# Patient Record
Sex: Female | Born: 1960
Health system: Southern US, Community
[De-identification: ages and names within clinical notes are randomized; demographics above are authoritative.]

## PROBLEM LIST (undated history)

## (undated) DIAGNOSIS — G709 Myoneural disorder, unspecified: Secondary | ICD-10-CM

## (undated) DIAGNOSIS — C4441 Basal cell carcinoma of skin of scalp and neck: Secondary | ICD-10-CM

## (undated) DIAGNOSIS — H269 Unspecified cataract: Secondary | ICD-10-CM

## (undated) DIAGNOSIS — J45909 Unspecified asthma, uncomplicated: Secondary | ICD-10-CM

## (undated) HISTORY — PX: AUGMENTATION MAMMAPLASTY: SUR837

## (undated) HISTORY — DX: Basal cell carcinoma of skin of scalp and neck: C44.41

## (undated) HISTORY — DX: Unspecified cataract: H26.9

## (undated) HISTORY — DX: Myoneural disorder, unspecified: G70.9

## (undated) HISTORY — DX: Unspecified asthma, uncomplicated: J45.909

---

## 1997-07-05 ENCOUNTER — Other Ambulatory Visit: Admission: RE | Admit: 1997-07-05 | Discharge: 1997-07-05 | Payer: Self-pay | Admitting: Obstetrics and Gynecology

## 1997-08-08 ENCOUNTER — Other Ambulatory Visit: Admission: RE | Admit: 1997-08-08 | Discharge: 1997-08-08 | Payer: Self-pay | Admitting: Ophthalmology

## 1998-01-15 ENCOUNTER — Inpatient Hospital Stay (HOSPITAL_COMMUNITY): Admission: AD | Admit: 1998-01-15 | Discharge: 1998-01-17 | Payer: Self-pay | Admitting: Obstetrics and Gynecology

## 1998-02-19 ENCOUNTER — Other Ambulatory Visit: Admission: RE | Admit: 1998-02-19 | Discharge: 1998-02-19 | Payer: Self-pay | Admitting: Obstetrics and Gynecology

## 1998-07-11 ENCOUNTER — Ambulatory Visit (HOSPITAL_BASED_OUTPATIENT_CLINIC_OR_DEPARTMENT_OTHER): Admission: RE | Admit: 1998-07-11 | Discharge: 1998-07-11 | Payer: Self-pay | Admitting: Plastic Surgery

## 1999-05-01 ENCOUNTER — Other Ambulatory Visit: Admission: RE | Admit: 1999-05-01 | Discharge: 1999-05-01 | Payer: Self-pay | Admitting: Obstetrics and Gynecology

## 2000-05-25 ENCOUNTER — Other Ambulatory Visit: Admission: RE | Admit: 2000-05-25 | Discharge: 2000-05-25 | Payer: Self-pay | Admitting: Obstetrics and Gynecology

## 2001-06-09 ENCOUNTER — Other Ambulatory Visit: Admission: RE | Admit: 2001-06-09 | Discharge: 2001-06-09 | Payer: Self-pay | Admitting: Obstetrics and Gynecology

## 2002-06-29 ENCOUNTER — Other Ambulatory Visit: Admission: RE | Admit: 2002-06-29 | Discharge: 2002-06-29 | Payer: Self-pay | Admitting: Obstetrics and Gynecology

## 2003-12-21 ENCOUNTER — Other Ambulatory Visit: Admission: RE | Admit: 2003-12-21 | Discharge: 2003-12-21 | Payer: Self-pay | Admitting: Obstetrics and Gynecology

## 2004-12-29 ENCOUNTER — Other Ambulatory Visit: Admission: RE | Admit: 2004-12-29 | Discharge: 2004-12-29 | Payer: Self-pay | Admitting: Obstetrics and Gynecology

## 2005-08-03 ENCOUNTER — Encounter: Admission: RE | Admit: 2005-08-03 | Discharge: 2005-08-03 | Payer: Self-pay | Admitting: Obstetrics and Gynecology

## 2006-08-06 ENCOUNTER — Encounter: Admission: RE | Admit: 2006-08-06 | Discharge: 2006-08-06 | Payer: Self-pay | Admitting: Obstetrics and Gynecology

## 2007-08-08 ENCOUNTER — Encounter: Admission: RE | Admit: 2007-08-08 | Discharge: 2007-08-08 | Payer: Self-pay | Admitting: Obstetrics and Gynecology

## 2008-08-23 ENCOUNTER — Encounter: Admission: RE | Admit: 2008-08-23 | Discharge: 2008-08-23 | Payer: Self-pay | Admitting: Obstetrics and Gynecology

## 2009-09-26 ENCOUNTER — Encounter: Admission: RE | Admit: 2009-09-26 | Discharge: 2009-09-26 | Payer: Self-pay | Admitting: Obstetrics and Gynecology

## 2010-08-12 ENCOUNTER — Other Ambulatory Visit: Payer: Self-pay | Admitting: Obstetrics and Gynecology

## 2010-08-12 DIAGNOSIS — Z1231 Encounter for screening mammogram for malignant neoplasm of breast: Secondary | ICD-10-CM

## 2010-10-06 ENCOUNTER — Ambulatory Visit: Payer: Self-pay

## 2010-10-07 ENCOUNTER — Ambulatory Visit
Admission: RE | Admit: 2010-10-07 | Discharge: 2010-10-07 | Disposition: A | Discharge status: Home / Self Care | Payer: 59 | Source: Ambulatory Visit | Attending: Obstetrics and Gynecology | Admitting: Obstetrics and Gynecology

## 2010-10-07 DIAGNOSIS — Z1231 Encounter for screening mammogram for malignant neoplasm of breast: Secondary | ICD-10-CM

## 2010-12-23 ENCOUNTER — Encounter (INDEPENDENT_AMBULATORY_CARE_PROVIDER_SITE_OTHER): Payer: 59 | Admitting: Ophthalmology

## 2010-12-23 DIAGNOSIS — G35 Multiple sclerosis: Secondary | ICD-10-CM

## 2010-12-23 DIAGNOSIS — H538 Other visual disturbances: Secondary | ICD-10-CM

## 2010-12-23 DIAGNOSIS — H469 Unspecified optic neuritis: Secondary | ICD-10-CM

## 2010-12-23 DIAGNOSIS — H43819 Vitreous degeneration, unspecified eye: Secondary | ICD-10-CM

## 2011-04-28 ENCOUNTER — Encounter (INDEPENDENT_AMBULATORY_CARE_PROVIDER_SITE_OTHER): Payer: 59 | Admitting: Ophthalmology

## 2011-07-28 ENCOUNTER — Encounter (INDEPENDENT_AMBULATORY_CARE_PROVIDER_SITE_OTHER): Payer: 59 | Admitting: Ophthalmology

## 2011-08-03 ENCOUNTER — Encounter (INDEPENDENT_AMBULATORY_CARE_PROVIDER_SITE_OTHER): Payer: 59 | Admitting: Ophthalmology

## 2011-08-03 DIAGNOSIS — H43819 Vitreous degeneration, unspecified eye: Secondary | ICD-10-CM

## 2011-08-03 DIAGNOSIS — H469 Unspecified optic neuritis: Secondary | ICD-10-CM

## 2011-09-01 ENCOUNTER — Other Ambulatory Visit: Payer: Self-pay | Admitting: Obstetrics and Gynecology

## 2011-09-01 DIAGNOSIS — Z1231 Encounter for screening mammogram for malignant neoplasm of breast: Secondary | ICD-10-CM

## 2011-11-18 ENCOUNTER — Ambulatory Visit
Admission: RE | Admit: 2011-11-18 | Discharge: 2011-11-18 | Disposition: A | Discharge status: Home / Self Care | Payer: 59 | Source: Ambulatory Visit | Attending: Obstetrics and Gynecology | Admitting: Obstetrics and Gynecology

## 2011-11-18 DIAGNOSIS — Z1231 Encounter for screening mammogram for malignant neoplasm of breast: Secondary | ICD-10-CM

## 2012-08-02 ENCOUNTER — Ambulatory Visit (INDEPENDENT_AMBULATORY_CARE_PROVIDER_SITE_OTHER): Payer: 59 | Admitting: Ophthalmology

## 2012-11-22 ENCOUNTER — Other Ambulatory Visit: Payer: Self-pay

## 2012-11-22 DIAGNOSIS — Z1231 Encounter for screening mammogram for malignant neoplasm of breast: Secondary | ICD-10-CM

## 2012-12-27 ENCOUNTER — Ambulatory Visit
Admission: RE | Admit: 2012-12-27 | Discharge: 2012-12-27 | Disposition: A | Discharge status: Home / Self Care | Payer: BC Managed Care – PPO | Source: Ambulatory Visit

## 2012-12-27 DIAGNOSIS — Z1231 Encounter for screening mammogram for malignant neoplasm of breast: Secondary | ICD-10-CM

## 2013-11-30 ENCOUNTER — Other Ambulatory Visit: Payer: Self-pay

## 2013-11-30 DIAGNOSIS — Z1231 Encounter for screening mammogram for malignant neoplasm of breast: Secondary | ICD-10-CM

## 2014-04-03 ENCOUNTER — Ambulatory Visit
Admission: RE | Admit: 2014-04-03 | Discharge: 2014-04-03 | Disposition: A | Discharge status: Home / Self Care | Payer: BLUE CROSS/BLUE SHIELD | Source: Ambulatory Visit

## 2014-04-03 DIAGNOSIS — Z1231 Encounter for screening mammogram for malignant neoplasm of breast: Secondary | ICD-10-CM

## 2014-08-30 ENCOUNTER — Other Ambulatory Visit: Payer: Self-pay | Admitting: General Surgery

## 2014-08-30 DIAGNOSIS — R1909 Other intra-abdominal and pelvic swelling, mass and lump: Secondary | ICD-10-CM

## 2014-09-04 ENCOUNTER — Ambulatory Visit
Admission: RE | Admit: 2014-09-04 | Discharge: 2014-09-04 | Disposition: A | Discharge status: Home / Self Care | Payer: BLUE CROSS/BLUE SHIELD | Source: Ambulatory Visit | Attending: General Surgery | Admitting: General Surgery

## 2014-09-04 MED ORDER — IOHEXOL 300 MG/ML  SOLN
100.0000 mL | Freq: Once | INTRAMUSCULAR | Status: AC | PRN
  Administered 2014-09-04: 100 mL via INTRAVENOUS

## 2015-05-27 ENCOUNTER — Other Ambulatory Visit: Payer: Self-pay

## 2015-05-27 DIAGNOSIS — Z1231 Encounter for screening mammogram for malignant neoplasm of breast: Secondary | ICD-10-CM

## 2015-06-25 ENCOUNTER — Ambulatory Visit: Payer: BLUE CROSS/BLUE SHIELD

## 2015-07-10 ENCOUNTER — Ambulatory Visit: Payer: BLUE CROSS/BLUE SHIELD

## 2015-07-12 ENCOUNTER — Ambulatory Visit
Admission: RE | Admit: 2015-07-12 | Discharge: 2015-07-12 | Disposition: A | Discharge status: Home / Self Care | Payer: BLUE CROSS/BLUE SHIELD | Source: Ambulatory Visit

## 2015-07-12 DIAGNOSIS — Z1231 Encounter for screening mammogram for malignant neoplasm of breast: Secondary | ICD-10-CM

## 2016-03-27 ENCOUNTER — Other Ambulatory Visit: Payer: Self-pay | Admitting: Obstetrics and Gynecology

## 2016-03-27 ENCOUNTER — Ambulatory Visit
Admission: RE | Admit: 2016-03-27 | Discharge: 2016-03-27 | Disposition: A | Discharge status: Home / Self Care | Payer: BLUE CROSS/BLUE SHIELD | Source: Ambulatory Visit | Attending: Obstetrics and Gynecology | Admitting: Obstetrics and Gynecology

## 2016-03-27 DIAGNOSIS — Z09 Encounter for follow-up examination after completed treatment for conditions other than malignant neoplasm: Secondary | ICD-10-CM

## 2016-06-29 ENCOUNTER — Other Ambulatory Visit: Payer: Self-pay | Admitting: Obstetrics and Gynecology

## 2016-06-29 DIAGNOSIS — Z1231 Encounter for screening mammogram for malignant neoplasm of breast: Secondary | ICD-10-CM

## 2016-07-17 ENCOUNTER — Ambulatory Visit: Payer: BLUE CROSS/BLUE SHIELD

## 2016-08-07 ENCOUNTER — Ambulatory Visit: Payer: BLUE CROSS/BLUE SHIELD

## 2016-09-10 ENCOUNTER — Ambulatory Visit: Payer: BLUE CROSS/BLUE SHIELD

## 2016-09-18 ENCOUNTER — Ambulatory Visit
Admission: RE | Admit: 2016-09-18 | Discharge: 2016-09-18 | Disposition: A | Discharge status: Home / Self Care | Payer: BLUE CROSS/BLUE SHIELD | Source: Ambulatory Visit | Attending: Obstetrics and Gynecology | Admitting: Obstetrics and Gynecology

## 2016-09-18 DIAGNOSIS — Z1231 Encounter for screening mammogram for malignant neoplasm of breast: Secondary | ICD-10-CM

## 2017-08-26 ENCOUNTER — Other Ambulatory Visit: Payer: Self-pay | Admitting: Obstetrics and Gynecology

## 2017-08-26 DIAGNOSIS — Z1231 Encounter for screening mammogram for malignant neoplasm of breast: Secondary | ICD-10-CM

## 2017-09-29 ENCOUNTER — Ambulatory Visit: Payer: BLUE CROSS/BLUE SHIELD

## 2017-10-21 ENCOUNTER — Ambulatory Visit
Admission: RE | Admit: 2017-10-21 | Discharge: 2017-10-21 | Disposition: A | Discharge status: Home / Self Care | Payer: BLUE CROSS/BLUE SHIELD | Source: Ambulatory Visit | Attending: Obstetrics and Gynecology | Admitting: Obstetrics and Gynecology

## 2017-10-21 DIAGNOSIS — Z1231 Encounter for screening mammogram for malignant neoplasm of breast: Secondary | ICD-10-CM

## 2017-11-29 DIAGNOSIS — X32XXXD Exposure to sunlight, subsequent encounter: Secondary | ICD-10-CM | POA: Diagnosis not present

## 2017-11-29 DIAGNOSIS — L570 Actinic keratosis: Secondary | ICD-10-CM | POA: Diagnosis not present

## 2017-11-29 DIAGNOSIS — L821 Other seborrheic keratosis: Secondary | ICD-10-CM | POA: Diagnosis not present

## 2017-11-29 DIAGNOSIS — D225 Melanocytic nevi of trunk: Secondary | ICD-10-CM | POA: Diagnosis not present

## 2017-12-23 DIAGNOSIS — C44212 Basal cell carcinoma of skin of right ear and external auricular canal: Secondary | ICD-10-CM | POA: Diagnosis not present

## 2018-02-22 DIAGNOSIS — G35 Multiple sclerosis: Secondary | ICD-10-CM | POA: Diagnosis not present

## 2018-02-22 DIAGNOSIS — Z79899 Other long term (current) drug therapy: Secondary | ICD-10-CM | POA: Diagnosis not present

## 2018-03-29 DIAGNOSIS — C44212 Basal cell carcinoma of skin of right ear and external auricular canal: Secondary | ICD-10-CM | POA: Diagnosis not present

## 2018-04-28 DIAGNOSIS — Z01419 Encounter for gynecological examination (general) (routine) without abnormal findings: Secondary | ICD-10-CM | POA: Diagnosis not present

## 2018-04-28 DIAGNOSIS — Z85820 Personal history of malignant melanoma of skin: Secondary | ICD-10-CM | POA: Diagnosis not present

## 2018-04-28 DIAGNOSIS — Z800 Family history of malignant neoplasm of digestive organs: Secondary | ICD-10-CM | POA: Diagnosis not present

## 2018-04-28 DIAGNOSIS — Z8049 Family history of malignant neoplasm of other genital organs: Secondary | ICD-10-CM | POA: Diagnosis not present

## 2018-04-28 DIAGNOSIS — Z6820 Body mass index (BMI) 20.0-20.9, adult: Secondary | ICD-10-CM | POA: Diagnosis not present

## 2018-04-28 DIAGNOSIS — Z8042 Family history of malignant neoplasm of prostate: Secondary | ICD-10-CM | POA: Diagnosis not present

## 2018-05-09 DIAGNOSIS — X32XXXD Exposure to sunlight, subsequent encounter: Secondary | ICD-10-CM | POA: Diagnosis not present

## 2018-05-09 DIAGNOSIS — L820 Inflamed seborrheic keratosis: Secondary | ICD-10-CM | POA: Diagnosis not present

## 2018-05-09 DIAGNOSIS — L570 Actinic keratosis: Secondary | ICD-10-CM | POA: Diagnosis not present

## 2018-05-18 DIAGNOSIS — J0100 Acute maxillary sinusitis, unspecified: Secondary | ICD-10-CM | POA: Diagnosis not present

## 2018-05-26 DIAGNOSIS — Z13820 Encounter for screening for osteoporosis: Secondary | ICD-10-CM | POA: Diagnosis not present

## 2018-05-31 DIAGNOSIS — Z0000 Encounter for general adult medical examination without abnormal findings: Secondary | ICD-10-CM | POA: Diagnosis not present

## 2018-06-07 DIAGNOSIS — Z23 Encounter for immunization: Secondary | ICD-10-CM | POA: Diagnosis not present

## 2018-06-07 DIAGNOSIS — Z0000 Encounter for general adult medical examination without abnormal findings: Secondary | ICD-10-CM | POA: Diagnosis not present

## 2018-08-30 DIAGNOSIS — D1039 Benign neoplasm of other parts of mouth: Secondary | ICD-10-CM | POA: Diagnosis not present

## 2018-09-02 DIAGNOSIS — R350 Frequency of micturition: Secondary | ICD-10-CM | POA: Diagnosis not present

## 2018-09-02 DIAGNOSIS — R3915 Urgency of urination: Secondary | ICD-10-CM | POA: Diagnosis not present

## 2018-09-02 DIAGNOSIS — N319 Neuromuscular dysfunction of bladder, unspecified: Secondary | ICD-10-CM | POA: Diagnosis not present

## 2018-09-02 DIAGNOSIS — N3941 Urge incontinence: Secondary | ICD-10-CM | POA: Diagnosis not present

## 2018-09-28 DIAGNOSIS — G35 Multiple sclerosis: Secondary | ICD-10-CM | POA: Diagnosis not present

## 2018-10-04 ENCOUNTER — Other Ambulatory Visit: Payer: Self-pay | Admitting: Obstetrics and Gynecology

## 2018-10-04 DIAGNOSIS — Z1231 Encounter for screening mammogram for malignant neoplasm of breast: Secondary | ICD-10-CM

## 2018-11-21 ENCOUNTER — Ambulatory Visit: Payer: BLUE CROSS/BLUE SHIELD

## 2018-12-14 DIAGNOSIS — Z08 Encounter for follow-up examination after completed treatment for malignant neoplasm: Secondary | ICD-10-CM | POA: Diagnosis not present

## 2018-12-14 DIAGNOSIS — X32XXXD Exposure to sunlight, subsequent encounter: Secondary | ICD-10-CM | POA: Diagnosis not present

## 2018-12-14 DIAGNOSIS — L820 Inflamed seborrheic keratosis: Secondary | ICD-10-CM | POA: Diagnosis not present

## 2018-12-14 DIAGNOSIS — L570 Actinic keratosis: Secondary | ICD-10-CM | POA: Diagnosis not present

## 2018-12-14 DIAGNOSIS — Z85820 Personal history of malignant melanoma of skin: Secondary | ICD-10-CM | POA: Diagnosis not present

## 2018-12-14 DIAGNOSIS — Z1283 Encounter for screening for malignant neoplasm of skin: Secondary | ICD-10-CM | POA: Diagnosis not present

## 2019-01-02 ENCOUNTER — Ambulatory Visit: Payer: Self-pay

## 2019-02-13 ENCOUNTER — Ambulatory Visit
Admission: RE | Admit: 2019-02-13 | Discharge: 2019-02-13 | Disposition: A | Discharge status: Home / Self Care | Payer: BC Managed Care – PPO | Source: Ambulatory Visit | Attending: Obstetrics and Gynecology | Admitting: Obstetrics and Gynecology

## 2019-02-13 ENCOUNTER — Other Ambulatory Visit: Payer: Self-pay

## 2019-02-13 DIAGNOSIS — L820 Inflamed seborrheic keratosis: Secondary | ICD-10-CM | POA: Diagnosis not present

## 2019-02-13 DIAGNOSIS — L821 Other seborrheic keratosis: Secondary | ICD-10-CM | POA: Diagnosis not present

## 2019-02-13 DIAGNOSIS — Z1231 Encounter for screening mammogram for malignant neoplasm of breast: Secondary | ICD-10-CM

## 2019-02-14 DIAGNOSIS — Z20828 Contact with and (suspected) exposure to other viral communicable diseases: Secondary | ICD-10-CM | POA: Diagnosis not present

## 2019-03-28 DIAGNOSIS — Z03818 Encounter for observation for suspected exposure to other biological agents ruled out: Secondary | ICD-10-CM | POA: Diagnosis not present

## 2019-03-29 DIAGNOSIS — G35 Multiple sclerosis: Secondary | ICD-10-CM | POA: Diagnosis not present

## 2019-03-29 DIAGNOSIS — Z79899 Other long term (current) drug therapy: Secondary | ICD-10-CM | POA: Diagnosis not present

## 2019-04-07 DIAGNOSIS — Z20828 Contact with and (suspected) exposure to other viral communicable diseases: Secondary | ICD-10-CM | POA: Diagnosis not present

## 2019-04-07 DIAGNOSIS — Z03818 Encounter for observation for suspected exposure to other biological agents ruled out: Secondary | ICD-10-CM | POA: Diagnosis not present

## 2019-05-03 DIAGNOSIS — L821 Other seborrheic keratosis: Secondary | ICD-10-CM | POA: Diagnosis not present

## 2019-05-03 DIAGNOSIS — N390 Urinary tract infection, site not specified: Secondary | ICD-10-CM | POA: Diagnosis not present

## 2019-05-03 DIAGNOSIS — Z01419 Encounter for gynecological examination (general) (routine) without abnormal findings: Secondary | ICD-10-CM | POA: Diagnosis not present

## 2019-05-03 DIAGNOSIS — X32XXXD Exposure to sunlight, subsequent encounter: Secondary | ICD-10-CM | POA: Diagnosis not present

## 2019-05-03 DIAGNOSIS — L570 Actinic keratosis: Secondary | ICD-10-CM | POA: Diagnosis not present

## 2019-05-03 DIAGNOSIS — Z6820 Body mass index (BMI) 20.0-20.9, adult: Secondary | ICD-10-CM | POA: Diagnosis not present

## 2019-05-12 DIAGNOSIS — D485 Neoplasm of uncertain behavior of skin: Secondary | ICD-10-CM | POA: Diagnosis not present

## 2019-05-12 DIAGNOSIS — D2239 Melanocytic nevi of other parts of face: Secondary | ICD-10-CM | POA: Diagnosis not present

## 2019-05-12 DIAGNOSIS — L821 Other seborrheic keratosis: Secondary | ICD-10-CM | POA: Diagnosis not present

## 2019-05-23 DIAGNOSIS — S52501A Unspecified fracture of the lower end of right radius, initial encounter for closed fracture: Secondary | ICD-10-CM | POA: Diagnosis not present

## 2019-05-24 DIAGNOSIS — S52501A Unspecified fracture of the lower end of right radius, initial encounter for closed fracture: Secondary | ICD-10-CM | POA: Diagnosis not present

## 2019-05-25 DIAGNOSIS — Y999 Unspecified external cause status: Secondary | ICD-10-CM | POA: Diagnosis not present

## 2019-05-25 DIAGNOSIS — S52571A Other intraarticular fracture of lower end of right radius, initial encounter for closed fracture: Secondary | ICD-10-CM | POA: Diagnosis not present

## 2019-05-25 DIAGNOSIS — S52501A Unspecified fracture of the lower end of right radius, initial encounter for closed fracture: Secondary | ICD-10-CM | POA: Diagnosis not present

## 2019-05-25 DIAGNOSIS — X58XXXA Exposure to other specified factors, initial encounter: Secondary | ICD-10-CM | POA: Diagnosis not present

## 2019-06-02 DIAGNOSIS — S52501D Unspecified fracture of the lower end of right radius, subsequent encounter for closed fracture with routine healing: Secondary | ICD-10-CM | POA: Diagnosis not present

## 2019-06-06 DIAGNOSIS — N390 Urinary tract infection, site not specified: Secondary | ICD-10-CM | POA: Diagnosis not present

## 2019-06-12 DIAGNOSIS — Z13220 Encounter for screening for lipoid disorders: Secondary | ICD-10-CM | POA: Diagnosis not present

## 2019-06-12 DIAGNOSIS — G35 Multiple sclerosis: Secondary | ICD-10-CM | POA: Diagnosis not present

## 2019-06-12 DIAGNOSIS — M8580 Other specified disorders of bone density and structure, unspecified site: Secondary | ICD-10-CM | POA: Diagnosis not present

## 2019-06-12 DIAGNOSIS — S52591D Other fractures of lower end of right radius, subsequent encounter for closed fracture with routine healing: Secondary | ICD-10-CM | POA: Diagnosis not present

## 2019-06-12 DIAGNOSIS — Z85820 Personal history of malignant melanoma of skin: Secondary | ICD-10-CM | POA: Diagnosis not present

## 2019-06-14 DIAGNOSIS — L820 Inflamed seborrheic keratosis: Secondary | ICD-10-CM | POA: Diagnosis not present

## 2019-06-30 DIAGNOSIS — M25561 Pain in right knee: Secondary | ICD-10-CM | POA: Diagnosis not present

## 2019-06-30 DIAGNOSIS — S52501D Unspecified fracture of the lower end of right radius, subsequent encounter for closed fracture with routine healing: Secondary | ICD-10-CM | POA: Diagnosis not present

## 2019-07-03 DIAGNOSIS — G35 Multiple sclerosis: Secondary | ICD-10-CM | POA: Diagnosis not present

## 2019-07-12 DIAGNOSIS — L820 Inflamed seborrheic keratosis: Secondary | ICD-10-CM | POA: Diagnosis not present

## 2019-07-12 DIAGNOSIS — C44529 Squamous cell carcinoma of skin of other part of trunk: Secondary | ICD-10-CM | POA: Diagnosis not present

## 2019-07-12 DIAGNOSIS — C44521 Squamous cell carcinoma of skin of breast: Secondary | ICD-10-CM | POA: Diagnosis not present

## 2019-07-25 DIAGNOSIS — G35 Multiple sclerosis: Secondary | ICD-10-CM | POA: Diagnosis not present

## 2019-07-25 DIAGNOSIS — R2689 Other abnormalities of gait and mobility: Secondary | ICD-10-CM | POA: Diagnosis not present

## 2019-07-25 DIAGNOSIS — M9903 Segmental and somatic dysfunction of lumbar region: Secondary | ICD-10-CM | POA: Diagnosis not present

## 2019-07-25 DIAGNOSIS — M7541 Impingement syndrome of right shoulder: Secondary | ICD-10-CM | POA: Diagnosis not present

## 2019-07-28 DIAGNOSIS — G35 Multiple sclerosis: Secondary | ICD-10-CM | POA: Diagnosis not present

## 2019-07-28 DIAGNOSIS — R2689 Other abnormalities of gait and mobility: Secondary | ICD-10-CM | POA: Diagnosis not present

## 2019-07-28 DIAGNOSIS — M7541 Impingement syndrome of right shoulder: Secondary | ICD-10-CM | POA: Diagnosis not present

## 2019-07-28 DIAGNOSIS — M9903 Segmental and somatic dysfunction of lumbar region: Secondary | ICD-10-CM | POA: Diagnosis not present

## 2019-08-07 DIAGNOSIS — M9903 Segmental and somatic dysfunction of lumbar region: Secondary | ICD-10-CM | POA: Diagnosis not present

## 2019-08-07 DIAGNOSIS — M7541 Impingement syndrome of right shoulder: Secondary | ICD-10-CM | POA: Diagnosis not present

## 2019-08-07 DIAGNOSIS — G35 Multiple sclerosis: Secondary | ICD-10-CM | POA: Diagnosis not present

## 2019-08-07 DIAGNOSIS — R2689 Other abnormalities of gait and mobility: Secondary | ICD-10-CM | POA: Diagnosis not present

## 2019-08-09 DIAGNOSIS — Z1159 Encounter for screening for other viral diseases: Secondary | ICD-10-CM | POA: Diagnosis not present

## 2019-08-11 DIAGNOSIS — Z1211 Encounter for screening for malignant neoplasm of colon: Secondary | ICD-10-CM | POA: Diagnosis not present

## 2019-08-16 DIAGNOSIS — Z85828 Personal history of other malignant neoplasm of skin: Secondary | ICD-10-CM | POA: Diagnosis not present

## 2019-08-16 DIAGNOSIS — L818 Other specified disorders of pigmentation: Secondary | ICD-10-CM | POA: Diagnosis not present

## 2019-08-16 DIAGNOSIS — Z08 Encounter for follow-up examination after completed treatment for malignant neoplasm: Secondary | ICD-10-CM | POA: Diagnosis not present

## 2019-08-17 DIAGNOSIS — M7541 Impingement syndrome of right shoulder: Secondary | ICD-10-CM | POA: Diagnosis not present

## 2019-08-17 DIAGNOSIS — R2689 Other abnormalities of gait and mobility: Secondary | ICD-10-CM | POA: Diagnosis not present

## 2019-08-17 DIAGNOSIS — M9903 Segmental and somatic dysfunction of lumbar region: Secondary | ICD-10-CM | POA: Diagnosis not present

## 2019-08-17 DIAGNOSIS — G35 Multiple sclerosis: Secondary | ICD-10-CM | POA: Diagnosis not present

## 2019-09-04 DIAGNOSIS — M7541 Impingement syndrome of right shoulder: Secondary | ICD-10-CM | POA: Diagnosis not present

## 2019-09-04 DIAGNOSIS — R2689 Other abnormalities of gait and mobility: Secondary | ICD-10-CM | POA: Diagnosis not present

## 2019-09-04 DIAGNOSIS — G35 Multiple sclerosis: Secondary | ICD-10-CM | POA: Diagnosis not present

## 2019-09-04 DIAGNOSIS — M9903 Segmental and somatic dysfunction of lumbar region: Secondary | ICD-10-CM | POA: Diagnosis not present

## 2019-10-03 DIAGNOSIS — R350 Frequency of micturition: Secondary | ICD-10-CM | POA: Diagnosis not present

## 2019-10-04 DIAGNOSIS — G35 Multiple sclerosis: Secondary | ICD-10-CM | POA: Diagnosis not present

## 2019-10-25 DIAGNOSIS — L820 Inflamed seborrheic keratosis: Secondary | ICD-10-CM | POA: Diagnosis not present

## 2019-10-25 DIAGNOSIS — L570 Actinic keratosis: Secondary | ICD-10-CM | POA: Diagnosis not present

## 2019-10-25 DIAGNOSIS — X32XXXD Exposure to sunlight, subsequent encounter: Secondary | ICD-10-CM | POA: Diagnosis not present

## 2019-11-06 DIAGNOSIS — M4802 Spinal stenosis, cervical region: Secondary | ICD-10-CM | POA: Diagnosis not present

## 2019-11-09 DIAGNOSIS — G35 Multiple sclerosis: Secondary | ICD-10-CM | POA: Diagnosis not present

## 2019-11-27 DIAGNOSIS — G35 Multiple sclerosis: Secondary | ICD-10-CM | POA: Diagnosis not present

## 2019-11-28 DIAGNOSIS — G35 Multiple sclerosis: Secondary | ICD-10-CM | POA: Diagnosis not present

## 2019-11-29 DIAGNOSIS — G35 Multiple sclerosis: Secondary | ICD-10-CM | POA: Diagnosis not present

## 2020-01-17 ENCOUNTER — Other Ambulatory Visit: Payer: Self-pay | Admitting: Obstetrics and Gynecology

## 2020-01-17 DIAGNOSIS — Z1231 Encounter for screening mammogram for malignant neoplasm of breast: Secondary | ICD-10-CM

## 2020-01-31 DIAGNOSIS — X32XXXD Exposure to sunlight, subsequent encounter: Secondary | ICD-10-CM | POA: Diagnosis not present

## 2020-01-31 DIAGNOSIS — Z1283 Encounter for screening for malignant neoplasm of skin: Secondary | ICD-10-CM | POA: Diagnosis not present

## 2020-01-31 DIAGNOSIS — Z86006 Personal history of melanoma in-situ: Secondary | ICD-10-CM | POA: Diagnosis not present

## 2020-01-31 DIAGNOSIS — L570 Actinic keratosis: Secondary | ICD-10-CM | POA: Diagnosis not present

## 2020-01-31 DIAGNOSIS — L821 Other seborrheic keratosis: Secondary | ICD-10-CM | POA: Diagnosis not present

## 2020-01-31 DIAGNOSIS — L820 Inflamed seborrheic keratosis: Secondary | ICD-10-CM | POA: Diagnosis not present

## 2020-01-31 DIAGNOSIS — Z08 Encounter for follow-up examination after completed treatment for malignant neoplasm: Secondary | ICD-10-CM | POA: Diagnosis not present

## 2020-02-02 DIAGNOSIS — M25561 Pain in right knee: Secondary | ICD-10-CM | POA: Diagnosis not present

## 2020-02-08 DIAGNOSIS — M25562 Pain in left knee: Secondary | ICD-10-CM | POA: Diagnosis not present

## 2020-02-08 DIAGNOSIS — M25561 Pain in right knee: Secondary | ICD-10-CM | POA: Diagnosis not present

## 2020-02-22 DIAGNOSIS — M25561 Pain in right knee: Secondary | ICD-10-CM | POA: Diagnosis not present

## 2020-02-29 ENCOUNTER — Ambulatory Visit: Payer: BC Managed Care – PPO | Admitting: Sports Medicine

## 2020-03-06 ENCOUNTER — Ambulatory Visit: Payer: BC Managed Care – PPO

## 2020-03-07 ENCOUNTER — Ambulatory Visit: Payer: BC Managed Care – PPO | Admitting: Sports Medicine

## 2020-03-12 ENCOUNTER — Ambulatory Visit: Payer: BC Managed Care – PPO | Admitting: Sports Medicine

## 2020-03-12 ENCOUNTER — Encounter: Payer: Self-pay | Admitting: Sports Medicine

## 2020-03-12 ENCOUNTER — Other Ambulatory Visit: Payer: Self-pay

## 2020-03-12 VITALS — BP 108/68 | Ht 62.0 in | Wt 110.0 lb

## 2020-03-12 DIAGNOSIS — M2141 Flat foot [pes planus] (acquired), right foot: Secondary | ICD-10-CM

## 2020-03-12 DIAGNOSIS — M2142 Flat foot [pes planus] (acquired), left foot: Secondary | ICD-10-CM | POA: Diagnosis not present

## 2020-03-12 NOTE — Progress Notes (Signed)
   Subjective:    Patient ID: Tara Ross, female    DOB: 05-May-1960, 59 y.o.   MRN: [id number]  HPI chief complaint: Bilateral knee pain  Patient is a very pleasant 59 year old female referred over by Dr. Griffin Basil for possible custom orthotics.  She has a history of hypermobility and significant DJD in each knee.  Dr. Griffin Basil recommended custom orthotics for her pes planus.  She has not had custom orthotics in the past but her daughter has had them for hypermobility and has had good success with them.    Review of Systems    As above Objective:   Physical Exam  Well-developed, well-nourished.  No acute distress  Examination of both knees shows good range of motion.  No effusion.  Genu valgus with standing.  Patient has pronation and mild calcaneal valgus with standing.  Pronation with walking.  Slight antalgic gait.      Assessment & Plan:  Bilateral knee DJD Hypermobility Pes planus  I think custom orthotics are worth trying.  Patient is in agreement. Custom orthotics are created as below.  Patient's gait was neutral with orthotics in place.  She found them to be comfortable prior to leaving the office.  Follow-up as needed.  Patient was fitted for a : standard, cushioned, semi-rigid orthotic. The orthotic was heated and afterward the patient stood on the orthotic blank positioned on the orthotic stand. The patient was positioned in subtalar neutral position and 10 degrees of ankle dorsiflexion in a weight bearing stance. After completion of molding, a stable base was applied to the orthotic blank. The blank was ground to a stable position for weight bearing. Size: 6 Base: Blue EVA Posting: none Additional orthotic padding: none

## 2020-03-28 DIAGNOSIS — H5212 Myopia, left eye: Secondary | ICD-10-CM | POA: Diagnosis not present

## 2020-03-28 DIAGNOSIS — H468 Other optic neuritis: Secondary | ICD-10-CM | POA: Diagnosis not present

## 2020-04-03 DIAGNOSIS — L820 Inflamed seborrheic keratosis: Secondary | ICD-10-CM | POA: Diagnosis not present

## 2020-04-03 DIAGNOSIS — Z08 Encounter for follow-up examination after completed treatment for malignant neoplasm: Secondary | ICD-10-CM | POA: Diagnosis not present

## 2020-04-03 DIAGNOSIS — Z85820 Personal history of malignant melanoma of skin: Secondary | ICD-10-CM | POA: Diagnosis not present

## 2020-04-10 DIAGNOSIS — G35 Multiple sclerosis: Secondary | ICD-10-CM | POA: Diagnosis not present

## 2020-04-16 ENCOUNTER — Ambulatory Visit
Admission: RE | Admit: 2020-04-16 | Discharge: 2020-04-16 | Disposition: A | Discharge status: Home / Self Care | Payer: BC Managed Care – PPO | Source: Ambulatory Visit | Attending: Obstetrics and Gynecology | Admitting: Obstetrics and Gynecology

## 2020-04-16 ENCOUNTER — Other Ambulatory Visit: Payer: Self-pay

## 2020-04-16 DIAGNOSIS — Z1231 Encounter for screening mammogram for malignant neoplasm of breast: Secondary | ICD-10-CM

## 2020-05-08 DIAGNOSIS — Z01419 Encounter for gynecological examination (general) (routine) without abnormal findings: Secondary | ICD-10-CM | POA: Diagnosis not present

## 2020-05-08 DIAGNOSIS — Z6820 Body mass index (BMI) 20.0-20.9, adult: Secondary | ICD-10-CM | POA: Diagnosis not present

## 2020-05-22 DIAGNOSIS — Z13820 Encounter for screening for osteoporosis: Secondary | ICD-10-CM | POA: Diagnosis not present

## 2020-05-27 DIAGNOSIS — D0439 Carcinoma in situ of skin of other parts of face: Secondary | ICD-10-CM | POA: Diagnosis not present

## 2020-05-27 DIAGNOSIS — L820 Inflamed seborrheic keratosis: Secondary | ICD-10-CM | POA: Diagnosis not present

## 2020-05-27 DIAGNOSIS — L821 Other seborrheic keratosis: Secondary | ICD-10-CM | POA: Diagnosis not present

## 2020-06-25 DIAGNOSIS — M25561 Pain in right knee: Secondary | ICD-10-CM | POA: Diagnosis not present

## 2020-06-25 DIAGNOSIS — M25562 Pain in left knee: Secondary | ICD-10-CM | POA: Diagnosis not present

## 2020-07-10 DIAGNOSIS — M810 Age-related osteoporosis without current pathological fracture: Secondary | ICD-10-CM | POA: Diagnosis not present

## 2020-07-10 DIAGNOSIS — Z13220 Encounter for screening for lipoid disorders: Secondary | ICD-10-CM | POA: Diagnosis not present

## 2020-07-10 DIAGNOSIS — G35 Multiple sclerosis: Secondary | ICD-10-CM | POA: Diagnosis not present

## 2020-07-17 DIAGNOSIS — D0439 Carcinoma in situ of skin of other parts of face: Secondary | ICD-10-CM | POA: Diagnosis not present

## 2020-08-14 DIAGNOSIS — D0439 Carcinoma in situ of skin of other parts of face: Secondary | ICD-10-CM | POA: Diagnosis not present

## 2020-09-03 DIAGNOSIS — M25561 Pain in right knee: Secondary | ICD-10-CM | POA: Diagnosis not present

## 2020-09-03 DIAGNOSIS — M25562 Pain in left knee: Secondary | ICD-10-CM | POA: Diagnosis not present

## 2020-09-25 DIAGNOSIS — R059 Cough, unspecified: Secondary | ICD-10-CM | POA: Diagnosis not present

## 2020-09-25 DIAGNOSIS — H9201 Otalgia, right ear: Secondary | ICD-10-CM | POA: Diagnosis not present

## 2020-10-01 DIAGNOSIS — L570 Actinic keratosis: Secondary | ICD-10-CM | POA: Diagnosis not present

## 2020-10-01 DIAGNOSIS — X32XXXD Exposure to sunlight, subsequent encounter: Secondary | ICD-10-CM | POA: Diagnosis not present

## 2020-10-01 DIAGNOSIS — Z08 Encounter for follow-up examination after completed treatment for malignant neoplasm: Secondary | ICD-10-CM | POA: Diagnosis not present

## 2020-10-01 DIAGNOSIS — Z85828 Personal history of other malignant neoplasm of skin: Secondary | ICD-10-CM | POA: Diagnosis not present

## 2020-10-01 DIAGNOSIS — L821 Other seborrheic keratosis: Secondary | ICD-10-CM | POA: Diagnosis not present

## 2020-10-04 DIAGNOSIS — H9201 Otalgia, right ear: Secondary | ICD-10-CM | POA: Diagnosis not present

## 2020-10-04 DIAGNOSIS — J329 Chronic sinusitis, unspecified: Secondary | ICD-10-CM | POA: Diagnosis not present

## 2020-11-06 DIAGNOSIS — Z85820 Personal history of malignant melanoma of skin: Secondary | ICD-10-CM | POA: Diagnosis not present

## 2020-11-06 DIAGNOSIS — T148XXA Other injury of unspecified body region, initial encounter: Secondary | ICD-10-CM | POA: Diagnosis not present

## 2020-11-06 DIAGNOSIS — Z08 Encounter for follow-up examination after completed treatment for malignant neoplasm: Secondary | ICD-10-CM | POA: Diagnosis not present

## 2020-11-06 DIAGNOSIS — L820 Inflamed seborrheic keratosis: Secondary | ICD-10-CM | POA: Diagnosis not present

## 2020-11-06 DIAGNOSIS — X32XXXD Exposure to sunlight, subsequent encounter: Secondary | ICD-10-CM | POA: Diagnosis not present

## 2020-11-06 DIAGNOSIS — L570 Actinic keratosis: Secondary | ICD-10-CM | POA: Diagnosis not present

## 2020-11-21 DIAGNOSIS — M25562 Pain in left knee: Secondary | ICD-10-CM | POA: Diagnosis not present

## 2020-11-21 DIAGNOSIS — M25561 Pain in right knee: Secondary | ICD-10-CM | POA: Diagnosis not present

## 2020-11-29 DIAGNOSIS — Z23 Encounter for immunization: Secondary | ICD-10-CM | POA: Diagnosis not present

## 2020-11-29 DIAGNOSIS — R233 Spontaneous ecchymoses: Secondary | ICD-10-CM | POA: Diagnosis not present

## 2020-12-03 DIAGNOSIS — M1711 Unilateral primary osteoarthritis, right knee: Secondary | ICD-10-CM | POA: Diagnosis not present

## 2020-12-10 DIAGNOSIS — M1711 Unilateral primary osteoarthritis, right knee: Secondary | ICD-10-CM | POA: Diagnosis not present

## 2020-12-17 DIAGNOSIS — M1711 Unilateral primary osteoarthritis, right knee: Secondary | ICD-10-CM | POA: Diagnosis not present

## 2021-01-08 DIAGNOSIS — G35 Multiple sclerosis: Secondary | ICD-10-CM | POA: Diagnosis not present

## 2021-01-21 DIAGNOSIS — R233 Spontaneous ecchymoses: Secondary | ICD-10-CM | POA: Diagnosis not present

## 2021-01-22 DIAGNOSIS — Z85820 Personal history of malignant melanoma of skin: Secondary | ICD-10-CM | POA: Diagnosis not present

## 2021-01-22 DIAGNOSIS — Z08 Encounter for follow-up examination after completed treatment for malignant neoplasm: Secondary | ICD-10-CM | POA: Diagnosis not present

## 2021-01-22 DIAGNOSIS — L570 Actinic keratosis: Secondary | ICD-10-CM | POA: Diagnosis not present

## 2021-01-22 DIAGNOSIS — Z1283 Encounter for screening for malignant neoplasm of skin: Secondary | ICD-10-CM | POA: Diagnosis not present

## 2021-01-22 DIAGNOSIS — X32XXXD Exposure to sunlight, subsequent encounter: Secondary | ICD-10-CM | POA: Diagnosis not present

## 2021-01-22 DIAGNOSIS — L820 Inflamed seborrheic keratosis: Secondary | ICD-10-CM | POA: Diagnosis not present

## 2021-01-22 DIAGNOSIS — T148XXD Other injury of unspecified body region, subsequent encounter: Secondary | ICD-10-CM | POA: Diagnosis not present

## 2021-01-23 DIAGNOSIS — R350 Frequency of micturition: Secondary | ICD-10-CM | POA: Diagnosis not present

## 2021-01-23 DIAGNOSIS — R338 Other retention of urine: Secondary | ICD-10-CM | POA: Diagnosis not present

## 2021-01-23 DIAGNOSIS — N318 Other neuromuscular dysfunction of bladder: Secondary | ICD-10-CM | POA: Diagnosis not present

## 2021-01-23 DIAGNOSIS — N3941 Urge incontinence: Secondary | ICD-10-CM | POA: Diagnosis not present

## 2021-01-28 ENCOUNTER — Telehealth: Payer: Self-pay | Admitting: *Deleted

## 2021-02-20 ENCOUNTER — Ambulatory Visit: Payer: BC Managed Care – PPO | Admitting: Sports Medicine

## 2021-02-20 DIAGNOSIS — M79671 Pain in right foot: Secondary | ICD-10-CM

## 2021-02-20 DIAGNOSIS — M79672 Pain in left foot: Secondary | ICD-10-CM

## 2021-02-20 NOTE — Progress Notes (Signed)
CC; Bilat. Foot pain  Patient has a long history of foot pain She had tried custom orthotics but as of the past The thicker orthotics would not fit or become comfortable in most shoes  She always has had some hypermobility I have seen her daughter for this She is currently followed by Dr. Griffin Basil for significant arthritis in her right knee He thinks orthotics would be helpful if she has significant arch collapse in the right foot is pronated and rotated  Physical exam Thin pleasant female in no acute distress BP 104/70   Ht 5\' 2"  (1.575 m)   BMI 20.12 kg/m   Right knee shows some medial joint line hypertrophy There is a valgus thrust on walking However it has excellent flexion and almost full extension without pain Left knee has full flexion and extension without pain  Feet show collapse of the longitudinal arch  she has navicular drop and subtalar shift allowing her foot to rotate outward Left 1 has resting pronation but does not have the same degree of rotation  Leg lengths are equal

## 2021-02-20 NOTE — Assessment & Plan Note (Signed)
Patient was fitted for a : standard, cushioned, semi-rigid orthotic. The orthotic was heated and afterward the patient stood on the orthotic blank positioned on the orthotic stand. The patient was positioned in subtalar neutral position and 10 degrees of ankle dorsiflexion in a weight bearing stance. After completion of molding, a stable base was applied to the orthotic blank. The blank was ground to a stable position for weight bearing. Size:7 thin dress orthotics Base: Thin, firm EVA with medial to lateral taper Posting: Scaphoid pad on RT Additional orthotic padding: none  Her gait was significantly improved after completion of the orthotics She had good comfort We will recheck these as needed

## 2021-02-22 DIAGNOSIS — Z20822 Contact with and (suspected) exposure to covid-19: Secondary | ICD-10-CM | POA: Diagnosis not present

## 2021-02-27 ENCOUNTER — Ambulatory Visit: Payer: BC Managed Care – PPO | Admitting: Nurse Practitioner

## 2021-03-04 ENCOUNTER — Inpatient Hospital Stay: Payer: BC Managed Care – PPO | Attending: Nurse Practitioner | Admitting: Nurse Practitioner

## 2021-03-04 ENCOUNTER — Other Ambulatory Visit: Payer: Self-pay

## 2021-03-04 ENCOUNTER — Inpatient Hospital Stay: Payer: BC Managed Care – PPO

## 2021-03-04 ENCOUNTER — Encounter: Payer: Self-pay | Admitting: Nurse Practitioner

## 2021-03-04 VITALS — BP 129/59 | HR 87 | Temp 98.1°F | Resp 20 | Ht 62.0 in | Wt 103.2 lb

## 2021-03-04 DIAGNOSIS — R233 Spontaneous ecchymoses: Secondary | ICD-10-CM

## 2021-03-04 DIAGNOSIS — G35 Multiple sclerosis: Secondary | ICD-10-CM

## 2021-03-04 DIAGNOSIS — D72810 Lymphocytopenia: Secondary | ICD-10-CM | POA: Insufficient documentation

## 2021-03-04 DIAGNOSIS — Z79899 Other long term (current) drug therapy: Secondary | ICD-10-CM

## 2021-03-04 LAB — CBC WITH DIFFERENTIAL (CANCER CENTER ONLY)
Abs Immature Granulocytes: 0.02 10*3/uL (ref 0.00–0.07)
Basophils Absolute: 0 10*3/uL (ref 0.0–0.1)
Basophils Relative: 0 %
Eosinophils Absolute: 0 10*3/uL (ref 0.0–0.5)
Eosinophils Relative: 0 %
HCT: 41.1 % (ref 36.0–46.0)
Hemoglobin: 13.1 g/dL (ref 12.0–15.0)
Immature Granulocytes: 0 %
Lymphocytes Relative: 3 %
Lymphs Abs: 0.3 10*3/uL — ABNORMAL LOW (ref 0.7–4.0)
MCH: 29.8 pg (ref 26.0–34.0)
MCHC: 31.9 g/dL (ref 30.0–36.0)
MCV: 93.6 fL (ref 80.0–100.0)
Monocytes Absolute: 0.8 10*3/uL (ref 0.1–1.0)
Monocytes Relative: 9 %
Neutro Abs: 7.6 10*3/uL (ref 1.7–7.7)
Neutrophils Relative %: 88 %
Platelet Count: 374 10*3/uL (ref 150–400)
RBC: 4.39 MIL/uL (ref 3.87–5.11)
RDW: 13.2 % (ref 11.5–15.5)
WBC Count: 8.7 10*3/uL (ref 4.0–10.5)
nRBC: 0 % (ref 0.0–0.2)

## 2021-03-04 LAB — APTT: aPTT: 28 seconds (ref 24–36)

## 2021-03-04 LAB — PROTIME-INR
INR: 0.9 (ref 0.8–1.2)
Prothrombin Time: 12 seconds (ref 11.4–15.2)

## 2021-03-04 NOTE — Progress Notes (Signed)
New Hematology/Oncology Consult   Requesting MD: Dr. Kathyrn Lass  [phone]  Reason for Consult: Abnormal bruising  HPI: [name] is a 60 year old woman referred for evaluation of abnormal bruising.  In May of this year she hit the left knee on a table resulting in a wound, subsequent bruising.  She estimates it took 6 to 8 weeks for complete healing to occur.  Since then she has noticed any "pressure", minor trauma on the lower legs and forearms results in a bruise which is slow to heal.  She denies spontaneous bruising/bleeding.  She denies other bleeding such as epistaxis, gingival bleeding, blood in urine or stool.  She is postmenopausal.  When she did have a menstrual cycle she does not feel the bleeding was excessive or prolonged, describes as "normal".  She had an inguinal hernia repair 8 to 9 years ago with no bleeding complication.  She had wisdom teeth removed in her 52s with no bleeding issues.  There is no family history of a bleeding disorder.  She has never had a blood transfusion.  She reports noticing that her hair is thinner and she feels cold within the same timeframe as when she noticed easy bruising 6 to 8 months ago.  She does not take aspirin.  No nonsteroidals.  No herbal supplements.  She is on no new medications.  Labs from 11/29/2020-hemoglobin 12.8, white count 4.2, platelet count 285,000; PT 10 (9.1-12), INR 0.9 (0.9-1.2), PTT 29.  Lymphocyte count 0.3  Past medical history: Multiple sclerosis   Past Surgical History:  Procedure Laterality Date   AUGMENTATION MAMMAPLASTY Bilateral      Current Outpatient Medications:    antiseptic oral rinse (BIOTENE) LIQD, 15 mLs by Mouth Rinse route as needed for dry mouth., Disp: , Rfl:    Ascorbic Acid (VITAMIN C) 1000 MG tablet, Take by mouth., Disp: , Rfl:    Calcium Carb-Cholecalciferol (CALCIUM 1000 + D PO), 1 tablet with meals, Disp: , Rfl:    Cholecalciferol (VITAMIN D) 50 MCG (2000 UT) CAPS, 1 capsule, Disp: , Rfl:     DALFAMPRIDINE ER PO, Take 10 mg by mouth 2 (two) times daily., Disp: , Rfl:    ferrous sulfate 325 (65 FE) MG tablet, Take 325 mg by mouth daily with breakfast., Disp: , Rfl:    Fingolimod HCl 0.5 MG CAPS, See admin instructions., Disp: , Rfl:    Magnesium 500 MG CAPS, 1 tablet with a meal, Disp: , Rfl:    Multiple Vitamin (MULTIVITAMIN ADULT PO), Take by mouth., Disp: , Rfl:    oxybutynin (DITROPAN-XL) 5 MG 24 hr tablet, Take by mouth., Disp: , Rfl: :   No Known Allergies:  FH: No family history of a bleeding disorder  SOCIAL HISTORY: She lives in [place].  She is married.  She is a retired Marine scientist.  No tobacco use since her 72s.  Rare EtOH intake.  Review of Systems: In addition to what is noted in the HPI she denies fever/sweats.  Appetite described as "okay".  She estimates 10 pounds of unintentional weight loss over the past 1 to 2 years.  She describes herself as a "picky eater".  No unusual headaches.  No vision change.  No dysphagia.  No nausea or vomiting.  No shortness of breath.  She has an occasional cough.  No change in bowel habits.  Periodic urinary frequency.  Physical Exam:  Blood pressure (!) 129/59, pulse 87, temperature 98.1 F (36.7 C), temperature source Oral, resp. rate 20, height 5'  2" (1.575 m), weight 103 lb 3.2 oz (46.8 kg), SpO2 100 %.  HEENT: No bruising or bleeding in the oral cavity. Lungs: Lungs clear bilaterally. Cardiac: Regular rate and rhythm. Abdomen: No hepatosplenomegaly. Vascular: No leg edema. Lymph nodes: No palpable cervical, supraclavicular, axillary or inguinal lymph nodes. Skin: Several ecchymoses at the right lower knee and just inferior to the knee.  Changes of senile purpura over the forearms.  No ecchymoses on the trunk.  LABS: Pending  RADIOLOGY:  No results found.  Assessment and Plan:   6 to 15-month history of easy bruising in the setting of minor trauma Multiple sclerosis  Ms. Schiller has been referred for evaluation  of easy bruising.  The bruising only occurs on the arms and lower legs in the setting of minor trauma.  No spontaneous bruising/bleeding.  The bruising may be due to senile purpura.  She will return to the lab today for a CBC, PT/PTT, von Willebrand panel and factor XIII activity.  We will contact her once those results are available.  We did not schedule formal follow-up at this time but will plan to see her back with any concerning laboratory findings or spontaneous bruising/bleeding.  Patient seen with Dr. Luberta Robertson, NP 03/04/2021, 1:12 PM   This was a shared visit with Ned Card.  [name] was interviewed and examined.  She is referred for evaluation of easy bruising.  The bruising occurs with trauma.  I have a low clinical suspicion for an inherited or acquired bleeding disorder.  She has undergone surgical procedures without excessive bleeding.  The platelet count has been normal.  I suspect the bruising is related to senile purpura and trauma.  We will obtain additional diagnostic evaluation to rule out a bleeding disorder.  I cannot relate the easy bruising to her medical regimen.  She reports taking same medications for multiple sclerosis for many years.  She reports chronic leukopenia (lymphopenia), potentially related to fingolimod.  We will contact her when the above laboratory evaluation has resulted.  I was present greater than 50% of today's visit.  I informed medical decision making.  Julieanne Manson, MD

## 2021-03-05 LAB — VON WILLEBRAND PANEL
Coagulation Factor VIII: 96 % (ref 56–140)
Ristocetin Co-factor, Plasma: 137 % (ref 50–200)
Von Willebrand Antigen, Plasma: 176 % (ref 50–200)

## 2021-03-05 LAB — COAG STUDIES INTERP REPORT

## 2021-03-06 LAB — FACTOR 13 ACTIVITY: Factor XIII, Qualitative: NORMAL

## 2021-03-07 ENCOUNTER — Other Ambulatory Visit: Payer: Self-pay | Admitting: Obstetrics and Gynecology

## 2021-03-07 ENCOUNTER — Telehealth: Payer: Self-pay

## 2021-03-07 DIAGNOSIS — Z1231 Encounter for screening mammogram for malignant neoplasm of breast: Secondary | ICD-10-CM

## 2021-03-07 NOTE — Telephone Encounter (Signed)
Called and left a voice message

## 2021-03-07 NOTE — Telephone Encounter (Signed)
-----   Message from Owens Shark, NP sent at 03/07/2021  3:36 PM EST ----- Please let her know the coagulation studies are normal, follow-up as needed

## 2021-03-10 ENCOUNTER — Telehealth: Payer: Self-pay

## 2021-03-10 NOTE — Telephone Encounter (Signed)
Called and spoke to patient, Patient voiced understanding

## 2021-03-10 NOTE — Telephone Encounter (Signed)
-----   Message from Owens Shark, NP sent at 03/07/2021  3:36 PM EST ----- Please let her know the coagulation studies are normal, follow-up as needed

## 2021-04-23 ENCOUNTER — Ambulatory Visit
Admission: RE | Admit: 2021-04-23 | Discharge: 2021-04-23 | Disposition: A | Discharge status: Home / Self Care | Payer: BC Managed Care – PPO | Source: Ambulatory Visit

## 2021-04-23 DIAGNOSIS — Z1231 Encounter for screening mammogram for malignant neoplasm of breast: Secondary | ICD-10-CM | POA: Diagnosis not present

## 2021-04-29 DIAGNOSIS — X32XXXD Exposure to sunlight, subsequent encounter: Secondary | ICD-10-CM | POA: Diagnosis not present

## 2021-04-29 DIAGNOSIS — L570 Actinic keratosis: Secondary | ICD-10-CM | POA: Diagnosis not present

## 2021-05-14 DIAGNOSIS — E559 Vitamin D deficiency, unspecified: Secondary | ICD-10-CM | POA: Diagnosis not present

## 2021-05-14 DIAGNOSIS — L659 Nonscarring hair loss, unspecified: Secondary | ICD-10-CM | POA: Diagnosis not present

## 2021-06-09 DIAGNOSIS — X32XXXD Exposure to sunlight, subsequent encounter: Secondary | ICD-10-CM | POA: Diagnosis not present

## 2021-06-09 DIAGNOSIS — L578 Other skin changes due to chronic exposure to nonionizing radiation: Secondary | ICD-10-CM | POA: Diagnosis not present

## 2021-06-09 DIAGNOSIS — C4442 Squamous cell carcinoma of skin of scalp and neck: Secondary | ICD-10-CM | POA: Diagnosis not present

## 2021-06-09 DIAGNOSIS — L0102 Bockhart's impetigo: Secondary | ICD-10-CM | POA: Diagnosis not present

## 2021-06-09 DIAGNOSIS — L570 Actinic keratosis: Secondary | ICD-10-CM | POA: Diagnosis not present

## 2021-06-09 DIAGNOSIS — C44311 Basal cell carcinoma of skin of nose: Secondary | ICD-10-CM | POA: Diagnosis not present

## 2021-06-09 DIAGNOSIS — L820 Inflamed seborrheic keratosis: Secondary | ICD-10-CM | POA: Diagnosis not present

## 2021-06-12 DIAGNOSIS — Z124 Encounter for screening for malignant neoplasm of cervix: Secondary | ICD-10-CM | POA: Diagnosis not present

## 2021-06-12 DIAGNOSIS — Z1151 Encounter for screening for human papillomavirus (HPV): Secondary | ICD-10-CM | POA: Diagnosis not present

## 2021-06-12 DIAGNOSIS — Z01419 Encounter for gynecological examination (general) (routine) without abnormal findings: Secondary | ICD-10-CM | POA: Diagnosis not present

## 2021-06-12 DIAGNOSIS — Z681 Body mass index (BMI) 19 or less, adult: Secondary | ICD-10-CM | POA: Diagnosis not present

## 2021-06-26 DIAGNOSIS — R300 Dysuria: Secondary | ICD-10-CM | POA: Diagnosis not present

## 2021-07-07 DIAGNOSIS — L818 Other specified disorders of pigmentation: Secondary | ICD-10-CM | POA: Diagnosis not present

## 2021-07-07 DIAGNOSIS — C44311 Basal cell carcinoma of skin of nose: Secondary | ICD-10-CM | POA: Diagnosis not present

## 2021-07-09 DIAGNOSIS — G35 Multiple sclerosis: Secondary | ICD-10-CM | POA: Diagnosis not present

## 2021-07-09 DIAGNOSIS — Z7969 Long term (current) use of other immunomodulators and immunosuppressants: Secondary | ICD-10-CM | POA: Diagnosis not present

## 2021-07-31 DIAGNOSIS — Z1159 Encounter for screening for other viral diseases: Secondary | ICD-10-CM | POA: Diagnosis not present

## 2021-07-31 DIAGNOSIS — Z13220 Encounter for screening for lipoid disorders: Secondary | ICD-10-CM | POA: Diagnosis not present

## 2021-07-31 DIAGNOSIS — Z0000 Encounter for general adult medical examination without abnormal findings: Secondary | ICD-10-CM | POA: Diagnosis not present

## 2021-07-31 DIAGNOSIS — M810 Age-related osteoporosis without current pathological fracture: Secondary | ICD-10-CM | POA: Diagnosis not present

## 2021-08-12 DIAGNOSIS — G35 Multiple sclerosis: Secondary | ICD-10-CM | POA: Diagnosis not present

## 2021-08-26 DIAGNOSIS — G35 Multiple sclerosis: Secondary | ICD-10-CM | POA: Diagnosis not present

## 2021-08-29 DIAGNOSIS — M25572 Pain in left ankle and joints of left foot: Secondary | ICD-10-CM | POA: Diagnosis not present

## 2021-09-13 DIAGNOSIS — G35 Multiple sclerosis: Secondary | ICD-10-CM | POA: Diagnosis not present

## 2021-09-14 DIAGNOSIS — G35 Multiple sclerosis: Secondary | ICD-10-CM | POA: Diagnosis not present

## 2021-09-15 DIAGNOSIS — G35 Multiple sclerosis: Secondary | ICD-10-CM | POA: Diagnosis not present

## 2021-10-03 DIAGNOSIS — G35 Multiple sclerosis: Secondary | ICD-10-CM | POA: Diagnosis not present

## 2021-10-03 DIAGNOSIS — R29898 Other symptoms and signs involving the musculoskeletal system: Secondary | ICD-10-CM | POA: Diagnosis not present

## 2021-10-16 ENCOUNTER — Ambulatory Visit: Payer: BC Managed Care – PPO | Admitting: Physical Therapy

## 2021-10-16 ENCOUNTER — Ambulatory Visit: Payer: BC Managed Care – PPO

## 2021-10-20 ENCOUNTER — Ambulatory Visit: Payer: BC Managed Care – PPO | Admitting: Physical Therapy

## 2021-10-22 NOTE — Therapy (Signed)
OUTPATIENT PHYSICAL THERAPY NEURO EVALUATION   Patient Name: Tara Ross MRN: [id number] DOB:1960/06/03, 61 y.o., female Today's Date: 10/24/2021   PCP: [facility]  REFERRING PROVIDER: Ala Bent, MD   PT End of Session - 10/24/21 0915     Visit Number 1    Number of Visits 13    Date for PT Re-Evaluation 12/05/21    Authorization Type BCBS    PT Start Time 0758    PT Stop Time 0842    PT Time Calculation (min) 44 min    Equipment Utilized During Treatment Gait belt    Activity Tolerance Patient tolerated treatment well    Behavior During Therapy WFL for tasks assessed/performed             Past Medical History:  Diagnosis Date   Asthma    Basal cell carcinoma of scalp and skin of face    Cataract    Neuromuscular disorder [facility])    Past Surgical History:  Procedure Laterality Date   AUGMENTATION MAMMAPLASTY Bilateral    Patient Active Problem List   Diagnosis Date Noted   Foot pain, bilateral 02/20/2021    ONSET DATE: Last MS exacerbation in Spring 2023  REFERRING DIAG: G35 (ICD-10-CM) - Multiple sclerosis  THERAPY DIAG:  Muscle weakness (generalized)  Unsteadiness on feet  Other abnormalities of gait and mobility  Rationale for Evaluation and Treatment Rehabilitation  SUBJECTIVE:                                                                                                                                                                                              SUBJECTIVE STATEMENT: Patient reports that she has had MS for 30 years. Never had PT before. Had exacerbation in spring. Consistently weaker in the R LE. Also with trouble with gait speed and balance. Has never used an AD. Sees a Physiological scientist consistently. Denies sensation changes, dizziness, vision changes.  Pt accompanied by: self  PERTINENT HISTORY: asthma, MS; pt reports chronic R patellar dislocation   PAIN:  Are you having pain?  No  PRECAUTIONS: Fall  WEIGHT BEARING RESTRICTIONS No  FALLS: Has patient fallen in last 6 months? No  LIVING ENVIRONMENT: Lives with: lives with their family Lives in: House/apartment Stairs:  couple steps to get in and a 2nd story  Has following equipment at home: Crutches  PLOF: Independent  PATIENT GOALS improve balance and R LE strength   OBJECTIVE:   DIAGNOSTIC FINDINGS: 08/12/21 brain MRI: No specific evidence of progressive demyelinating disease. No acute abnormality or active demyelination identified.  COGNITION: Overall cognitive status: Within functional  limits for tasks assessed   SENSATION: WFL  COORDINATION: B Alt pron/supination intact; finger to nose intact; heel to shin intact   MUSCLE TONE: LLE: Mild and in L quad   POSTURE: No Significant postural limitations  LOWER EXTREMITY ROM:     Active  Right Eval Left Eval  Hip flexion    Hip extension    Hip abduction    Hip adduction    Hip internal rotation    Hip external rotation    Knee flexion    Knee extension    Ankle dorsiflexion 16 14  Ankle plantarflexion    Ankle inversion    Ankle eversion     (Blank rows = not tested)  LOWER EXTREMITY MMT:    MMT Right Eval Left Eval  Hip flexion 4 5  Hip extension    Hip abduction 4 4  Hip adduction 4+ 4+  Hip internal rotation    Hip external rotation    Knee flexion 4 4+  Knee extension 4 4+  Ankle dorsiflexion 4+ 4+  Ankle plantarflexion 4 (completed 20 reps with heavy anterior trunk lean) 4 (completed 20 reps with heavy anterior trunk lean)  Ankle inversion    Ankle eversion    (Blank rows = not tested)   GAIT: Gait pattern:  decreased L step length and R LE with reduced knee flexion through swing  Level of assistance: Complete Independence   FUNCTIONAL TESTs:   [facility] PT Assessment - 10/24/21 0001       Standardized Balance Assessment   Standardized Balance Assessment Five Times Sit to Stand;10 meter walk test    Five  times sit to stand comments  9.91 sec   without UEs; limited eccentric control   10 Meter Walk 12.51 sec      Functional Gait  Assessment   Gait assessed  Yes    Gait Level Surface Walks 20 ft in less than 7 sec but greater than 5.5 sec, uses assistive device, slower speed, mild gait deviations, or deviates 6-10 in outside of the 12 in walkway width.    Change in Gait Speed Able to change speed, demonstrates mild gait deviations, deviates 6-10 in outside of the 12 in walkway width, or no gait deviations, unable to achieve a major change in velocity, or uses a change in velocity, or uses an assistive device.    Gait with Horizontal Head Turns Performs head turns smoothly with slight change in gait velocity (eg, minor disruption to smooth gait path), deviates 6-10 in outside 12 in walkway width, or uses an assistive device.    Gait with Vertical Head Turns Performs task with slight change in gait velocity (eg, minor disruption to smooth gait path), deviates 6 - 10 in outside 12 in walkway width or uses assistive device    Gait and Pivot Turn Pivot turns safely within 3 sec and stops quickly with no loss of balance.    Step Over Obstacle Is able to step over 2 stacked shoe boxes taped together (9 in total height) without changing gait speed. No evidence of imbalance.    Gait with Narrow Base of Support Ambulates 4-7 steps.    Gait with Eyes Closed Walks 20 ft, uses assistive device, slower speed, mild gait deviations, deviates 6-10 in outside 12 in walkway width. Ambulates 20 ft in less than 9 sec but greater than 7 sec.    Ambulating Backwards Walks 20 ft, uses assistive device, slower speed, mild gait deviations, deviates 6-10 in  outside 12 in walkway width.    Steps Two feet to a stair, must use rail.    Total Score 20               PATIENT EDUCATION: Education details: exam findings, prognosis, POC, HEP Person educated: Patient Education method: Explanation, Demonstration, Tactile cues,  Verbal cues, and Handouts Education comprehension: verbalized understanding and returned demonstration   HOME EXERCISE PROGRAM: Access Code: [email or web address] URL: [email or web address] Date: 10/24/2021 Prepared by: [facility]  Program Notes perform at a counter top or corner for safety  Exercises - Standing Terminal Knee Extension with Resistance  - 1 x daily - 5 x weekly - 2 sets - 10 reps - Sit to Stand with Arms Crossed  - 1 x daily - 5 x weekly - 2 sets - 10 reps - Sidelying Hip Abduction  - 1 x daily - 5 x weekly - 2 sets - 10 reps - Tandem Walking with Counter Support  - 1 x daily - 5 x weekly - 2 sets - 10 reps - Backward Walking with Counter Support  - 1 x daily - 5 x weekly - 2 sets - 10 reps - Romberg Stance with Eyes Closed  - 1 x daily - 5 x weekly - 2 sets - 30 sec hold   GOALS: Goals reviewed with patient? Yes  SHORT TERM GOALS: Target date: 11/14/2021  Patient to be independent with initial HEP. Baseline: HEP initiated Goal status: INITIAL    LONG TERM GOALS: Target date: 12/05/2021  Patient to be independent with advanced HEP. Baseline: Not yet initiated  Goal status: INITIAL  Patient to demonstrate alternating reciprocal pattern when ascending and descending stairs with good stability and 1 handrail as needed.   Baseline: step-to pattern Goal status: INITIAL  Patient to score at least 24/30 on FGA in order to decrease risk of falls.  Baseline: 20/30 Goal status: INITIAL  Patient to demonstrate gait speed of 3.44 ft/sec in order to meet MCID. Baseline: 2.62 ft/sec Goal status: INITIAL  Patient to demonstrate 5xSTS test in <15 sec and with good eccentric control in order to demonstrate improved functional strength. Baseline: poor eccentric control  Goal status: INITIAL   ASSESSMENT:  CLINICAL IMPRESSION:  Patient is a 61 y/o F, PMH significant for MS, presenting to OPPT with c/o increased R LE  weakness and trouble with balance since exacerbation in Spring 2023. Patient continues to be active with her personal trainer. Patient today presenting with mild increased L quad tone, R>L LE weakness, gait deviations, decreased gait speed, and imbalance. Patient's score on FGA indicates an increased risk of falls. Patient was educated on gentle strengthening and balance HEP and reported understanding. Would benefit from skilled PT services 2 x/week for 6 weeks to address aforementioned impairments in order to optimize level of function.     OBJECTIVE IMPAIRMENTS Abnormal gait, decreased activity tolerance, decreased balance, difficulty walking, decreased strength, increased muscle spasms, and impaired tone.   ACTIVITY LIMITATIONS carrying, lifting, squatting, stairs, and transfers  PARTICIPATION LIMITATIONS: meal prep, cleaning, laundry, shopping, community activity, yard work, and church  PERSONAL FACTORS Age, Fitness, Past/current experiences, Time since onset of injury/illness/exacerbation, and 3+ comorbidities: asthma, MS, R patellar dislocation  are also affecting patient's functional outcome.   REHAB POTENTIAL: Good  CLINICAL DECISION MAKING: Evolving/moderate complexity  EVALUATION COMPLEXITY: Moderate  PLAN: PT FREQUENCY: 2x/week  PT DURATION: 6 weeks  PLANNED INTERVENTIONS: Therapeutic exercises, Therapeutic activity, Neuromuscular re-education,  Balance training, Gait training, Patient/Family education, Self Care, Joint mobilization, Stair training, Vestibular training, Canalith repositioning, Aquatic Therapy, Dry Needling, Electrical stimulation, Cryotherapy, Moist heat, Taping, Manual therapy, and Re-evaluation  PLAN FOR NEXT SESSION: reassess HEP, progress R quad and hip strength, high level balance activities    Janene Harvey, PT, DPT 10/24/21 9:16 AM  [facility] [address] [place], [place] [address] Phone #  [phone] Fax # [phone]

## 2021-10-24 ENCOUNTER — Other Ambulatory Visit: Payer: Self-pay

## 2021-10-24 ENCOUNTER — Encounter: Payer: Self-pay | Admitting: Physical Therapy

## 2021-10-24 ENCOUNTER — Ambulatory Visit: Payer: BC Managed Care – PPO | Attending: Neurology | Admitting: Physical Therapy

## 2021-10-24 DIAGNOSIS — R2681 Unsteadiness on feet: Secondary | ICD-10-CM | POA: Diagnosis not present

## 2021-10-24 DIAGNOSIS — M6281 Muscle weakness (generalized): Secondary | ICD-10-CM | POA: Diagnosis not present

## 2021-10-24 DIAGNOSIS — R2689 Other abnormalities of gait and mobility: Secondary | ICD-10-CM | POA: Diagnosis not present

## 2021-10-27 NOTE — Therapy (Signed)
OUTPATIENT PHYSICAL THERAPY NEURO TREATMENT   Patient Name: Tara Ross MRN: [id number] DOB:1960/04/20, 61 y.o., female Today's Date: 10/28/2021   PCP: [facility]  REFERRING PROVIDER: Ala Bent, MD   PT End of Session - 10/28/21 0932     Visit Number 2    Number of Visits 13    Date for PT Re-Evaluation 12/05/21    Authorization Type BCBS    PT Start Time 0848    PT Stop Time 0928    PT Time Calculation (min) 40 min    Activity Tolerance Patient tolerated treatment well    Behavior During Therapy [facility] for tasks assessed/performed              Past Medical History:  Diagnosis Date   Asthma    Basal cell carcinoma of scalp and skin of face    Cataract    Neuromuscular disorder [facility])    Past Surgical History:  Procedure Laterality Date   AUGMENTATION MAMMAPLASTY Bilateral    Patient Active Problem List   Diagnosis Date Noted   Foot pain, bilateral 02/20/2021    ONSET DATE: Last MS exacerbation in Spring 2023  REFERRING DIAG: G35 (ICD-10-CM) - Multiple sclerosis  THERAPY DIAG:  Muscle weakness (generalized)  Unsteadiness on feet  Other abnormalities of gait and mobility  Rationale for Evaluation and Treatment Rehabilitation  SUBJECTIVE:                                                                                                                                                                                              SUBJECTIVE STATEMENT: Reports that she did the exercises 3x/week and having some R knee pain from the TKE.  Pt accompanied by: self  PERTINENT HISTORY: asthma, MS; pt reports chronic R patellar dislocation   PAIN:  Are you having pain? No; reports 1-2/10 discomfort in R knee   PRECAUTIONS: Fall  PATIENT GOALS improve balance and R LE strength   OBJECTIVE:     TODAY'S TREATMENT: 10/28/21 Activity Comments  Recumbent bike L2.5 x 5 min   STS R foot back 10x Focus on controlled descent   R  quad set 10x5" Folded pillow under ankle  R SLR 10x  Quad lag on the way down   R mod thomas stretch 2x30" PT OP  R/L supine fig 4 stretch 2x30" With patient OP; cues to avoid hiking hip  Bridge with red TB 10x Cues to maintain tension on TB  Clamshell with red loop 2x10 each Cues to roll hips anteriorly; limited ROM on R side d/t more difficultly maintaining form   R  TKE with red TB 10x3" Required correction of form and encouraged decreased knee flexion- pt noted improved tolerance for this exercise           Below measures were taken at time of initial evaluation unless otherwise specified:  DIAGNOSTIC FINDINGS: 08/12/21 brain MRI: No specific evidence of progressive demyelinating disease. No acute abnormality or active demyelination identified.  COGNITION: Overall cognitive status: Within functional limits for tasks assessed   SENSATION: WFL  COORDINATION: B Alt pron/supination intact; finger to nose intact; heel to shin intact   MUSCLE TONE: LLE: Mild and in L quad   POSTURE: No Significant postural limitations  LOWER EXTREMITY ROM:     Active  Right Eval Left Eval  Hip flexion    Hip extension    Hip abduction    Hip adduction    Hip internal rotation    Hip external rotation    Knee flexion    Knee extension    Ankle dorsiflexion 16 14  Ankle plantarflexion    Ankle inversion    Ankle eversion     (Blank rows = not tested)  LOWER EXTREMITY MMT:    MMT Right Eval Left Eval  Hip flexion 4 5  Hip extension    Hip abduction 4 4  Hip adduction 4+ 4+  Hip internal rotation    Hip external rotation    Knee flexion 4 4+  Knee extension 4 4+  Ankle dorsiflexion 4+ 4+  Ankle plantarflexion 4 (completed 20 reps with heavy anterior trunk lean) 4 (completed 20 reps with heavy anterior trunk lean)  Ankle inversion    Ankle eversion    (Blank rows = not tested)   GAIT: Gait pattern:  decreased L step length and R LE with reduced knee flexion through  swing  Level of assistance: Complete Independence   PATIENT EDUCATION: Education details: exam findings, prognosis, POC, HEP Person educated: Patient Education method: Explanation, Demonstration, Tactile cues, Verbal cues, and Handouts Education comprehension: verbalized understanding and returned demonstration   HOME EXERCISE PROGRAM: Access Code: [email or web address] URL: [email or web address] Date: 10/24/2021 Prepared by: [facility]  Program Notes perform at a counter top or corner for safety  Exercises - Standing Terminal Knee Extension with Resistance  - 1 x daily - 5 x weekly - 2 sets - 10 reps - Sit to Stand with Arms Crossed  - 1 x daily - 5 x weekly - 2 sets - 10 reps - Sidelying Hip Abduction  - 1 x daily - 5 x weekly - 2 sets - 10 reps - Tandem Walking with Counter Support  - 1 x daily - 5 x weekly - 2 sets - 10 reps - Backward Walking with Counter Support  - 1 x daily - 5 x weekly - 2 sets - 10 reps - Romberg Stance with Eyes Closed  - 1 x daily - 5 x weekly - 2 sets - 30 sec hold   GOALS: Goals reviewed with patient? Yes  SHORT TERM GOALS: Target date: 11/14/2021  Patient to be independent with initial HEP. Baseline: HEP initiated Goal status: IN PROGRESS    LONG TERM GOALS: Target date: 12/05/2021  Patient to be independent with advanced HEP. Baseline: Not yet initiated  Goal status: IN PROGRESS  Patient to demonstrate alternating reciprocal pattern when ascending and descending stairs with good stability and 1 handrail as needed.   Baseline: step-to pattern Goal status: IN PROGRESS  Patient to score at least  24/30 on FGA in order to decrease risk of falls.  Baseline: 20/30 Goal status: IN PROGRESS  Patient to demonstrate gait speed of 3.44 ft/sec in order to meet MCID. Baseline: 2.62 ft/sec Goal status: INITIAL  Patient to demonstrate 5xSTS test in <15 sec and with good eccentric control in order to  demonstrate improved functional strength. Baseline: poor eccentric control  Goal status: IN PROGRESS   ASSESSMENT:  CLINICAL IMPRESSION: Patient arrived to session with report of mild R knee pain. Worked on addressing quad strengthening in supine which was tolerated well. Patient with c/o hip tightness this AM which was addressed with stretching. Hip strengthening activities were performed with intermittent cueing for proper form and with good carryover. Patient reported some knee pain with R TKE at home, thus this was reviewed and required correction of form. Patient noted improved tolerance for this exercise after review and reported sensation of improved hip tension at end of session.       OBJECTIVE IMPAIRMENTS Abnormal gait, decreased activity tolerance, decreased balance, difficulty walking, decreased strength, increased muscle spasms, and impaired tone.   ACTIVITY LIMITATIONS carrying, lifting, squatting, stairs, and transfers  PARTICIPATION LIMITATIONS: meal prep, cleaning, laundry, shopping, community activity, yard work, and church  PERSONAL FACTORS Age, Fitness, Past/current experiences, Time since onset of injury/illness/exacerbation, and 3+ comorbidities: asthma, MS, R patellar dislocation  are also affecting patient's functional outcome.   REHAB POTENTIAL: Good  CLINICAL DECISION MAKING: Evolving/moderate complexity  EVALUATION COMPLEXITY: Moderate  PLAN: PT FREQUENCY: 2x/week  PT DURATION: 6 weeks  PLANNED INTERVENTIONS: Therapeutic exercises, Therapeutic activity, Neuromuscular re-education, Balance training, Gait training, Patient/Family education, Self Care, Joint mobilization, Stair training, Vestibular training, Canalith repositioning, Aquatic Therapy, Dry Needling, Electrical stimulation, Cryotherapy, Moist heat, Taping, Manual therapy, and Re-evaluation  PLAN FOR NEXT SESSION: reassess HEP, progress R quad and hip strength, high level balance activities     Janene Harvey, PT, DPT 10/28/21 9:33 AM  [facility] at [facility] [address] [place], [place] [address] Phone # [phone] Fax # [phone]

## 2021-10-28 ENCOUNTER — Encounter: Payer: Self-pay | Admitting: Physical Therapy

## 2021-10-28 ENCOUNTER — Ambulatory Visit: Payer: BC Managed Care – PPO | Admitting: Physical Therapy

## 2021-10-28 DIAGNOSIS — M6281 Muscle weakness (generalized): Secondary | ICD-10-CM

## 2021-10-28 DIAGNOSIS — R2681 Unsteadiness on feet: Secondary | ICD-10-CM | POA: Diagnosis not present

## 2021-10-28 DIAGNOSIS — M47816 Spondylosis without myelopathy or radiculopathy, lumbar region: Secondary | ICD-10-CM | POA: Diagnosis not present

## 2021-10-28 DIAGNOSIS — R2689 Other abnormalities of gait and mobility: Secondary | ICD-10-CM

## 2021-10-30 ENCOUNTER — Encounter: Payer: BC Managed Care – PPO | Admitting: Sports Medicine

## 2021-10-30 NOTE — Therapy (Signed)
OUTPATIENT PHYSICAL THERAPY NEURO TREATMENT   Patient Name: Tara Ross MRN: [id number] DOB:Nov 25, 1960, 61 y.o., female Today's Date: 10/31/2021   PCP: [facility]  REFERRING PROVIDER: Ala Bent, MD   PT End of Session - 10/31/21 0851     Visit Number 3    Number of Visits 13    Date for PT Re-Evaluation 12/05/21    Authorization Type BCBS    PT Start Time 0758    PT Stop Time 0848    PT Time Calculation (min) 50 min    Equipment Utilized During Treatment Gait belt    Activity Tolerance Patient tolerated treatment well    Behavior During Therapy WFL for tasks assessed/performed               Past Medical History:  Diagnosis Date   Asthma    Basal cell carcinoma of scalp and skin of face    Cataract    Neuromuscular disorder [facility])    Past Surgical History:  Procedure Laterality Date   AUGMENTATION MAMMAPLASTY Bilateral    Patient Active Problem List   Diagnosis Date Noted   Foot pain, bilateral 02/20/2021    ONSET DATE: Last MS exacerbation in Spring 2023  REFERRING DIAG: G35 (ICD-10-CM) - Multiple sclerosis  THERAPY DIAG:  Muscle weakness (generalized)  Unsteadiness on feet  Other abnormalities of gait and mobility  Rationale for Evaluation and Treatment Rehabilitation  SUBJECTIVE:                                                                                                                                                                                              SUBJECTIVE STATEMENT: "Doing good, my knee is just my knee." The TKE exercise was much easier on the knee after HEP review last session.  Pt accompanied by: self  PERTINENT HISTORY: asthma, MS; pt reports chronic R patellar dislocation   PAIN:  Are you having pain? No; reports 1-2/10 discomfort in R knee   PRECAUTIONS: Fall  PATIENT GOALS improve balance and R LE strength   OBJECTIVE:     TODAY'S TREATMENT: 10/31/21 Activity Comments   Bike L2.5 x 6 min   STS red TB 2x With mirror feedback and manual cues to increase R wt shift  SL hip abduction 10x Cues for alignment   R step up, down slow on 6" step Good control; hesitancy unlocking R knee  R step downs with heel touch 4" 10x Quad weakness   Sidestepping with red loop around ankles and toes 2x75f each  Cueing to maintain proper H-K-A alignment   Sitting KTOS 30" each  tandem walk in II bars  Core instability and occasional UE support   HOME EXERCISE PROGRAM Last updated: 10/31/21 Access Code: [email or web address] URL: [email or web address] Date: 10/31/2021 Prepared by: [facility]  Program Notes perform at a counter top or corner for safety  Exercises - Standing Terminal Knee Extension with Resistance  - 1 x daily - 5 x weekly - 2 sets - 10 reps - Sit to Stand with Arms Crossed  - 1 x daily - 5 x weekly - 2 sets - 10 reps - Sidelying Hip Abduction  - 1 x daily - 5 x weekly - 2 sets - 10 reps - Tandem Walking with Counter Support  - 1 x daily - 5 x weekly - 2 sets - 10 reps - Backward Walking with Counter Support  - 1 x daily - 5 x weekly - 2 sets - 10 reps - Romberg Stance with Eyes Closed  - 1 x daily - 5 x weekly - 2 sets - 30 sec hold - Side Stepping with Resistance at Feet  - 1 x daily - 5 x weekly - 2 sets - 10 reps   PATIENT EDUCATION: Education details: HEP update Person educated: Patient Education method: Explanation, Demonstration, Tactile cues, Verbal cues, and Handouts Education comprehension: verbalized understanding and returned demonstration   Below measures were taken at time of initial evaluation unless otherwise specified:  DIAGNOSTIC FINDINGS: 08/12/21 brain MRI: No specific evidence of progressive demyelinating disease. No acute abnormality or active demyelination identified.  COGNITION: Overall cognitive status: Within functional limits for tasks  assessed   SENSATION: WFL  COORDINATION: B Alt pron/supination intact; finger to nose intact; heel to shin intact   MUSCLE TONE: LLE: Mild and in L quad   POSTURE: No Significant postural limitations  LOWER EXTREMITY ROM:     Active  Right Eval Left Eval  Hip flexion    Hip extension    Hip abduction    Hip adduction    Hip internal rotation    Hip external rotation    Knee flexion    Knee extension    Ankle dorsiflexion 16 14  Ankle plantarflexion    Ankle inversion    Ankle eversion     (Blank rows = not tested)  LOWER EXTREMITY MMT:    MMT Right Eval Left Eval  Hip flexion 4 5  Hip extension    Hip abduction 4 4  Hip adduction 4+ 4+  Hip internal rotation    Hip external rotation    Knee flexion 4 4+  Knee extension 4 4+  Ankle dorsiflexion 4+ 4+  Ankle plantarflexion 4 (completed 20 reps with heavy anterior trunk lean) 4 (completed 20 reps with heavy anterior trunk lean)  Ankle inversion    Ankle eversion    (Blank rows = not tested)   GAIT: Gait pattern:  decreased L step length and R LE with reduced knee flexion through swing  Level of assistance: Complete Independence   PATIENT EDUCATION: Education details: exam findings, prognosis, POC, HEP Person educated: Patient Education method: Explanation, Demonstration, Tactile cues, Verbal cues, and Handouts Education comprehension: verbalized understanding and returned demonstration   HOME EXERCISE PROGRAM: Access Code: [email or web address] URL: [email or web address] Date: 10/24/2021 Prepared by: [facility]  Program Notes perform at a counter top or corner for safety  Exercises - Standing Terminal Knee Extension with Resistance  - 1 x daily - 5 x weekly - 2  sets - 10 reps - Sit to Stand with Arms Crossed  - 1 x daily - 5 x weekly - 2 sets - 10 reps - Sidelying Hip Abduction  - 1 x daily - 5 x weekly - 2 sets - 10 reps - Tandem Walking with Counter  Support  - 1 x daily - 5 x weekly - 2 sets - 10 reps - Backward Walking with Counter Support  - 1 x daily - 5 x weekly - 2 sets - 10 reps - Romberg Stance with Eyes Closed  - 1 x daily - 5 x weekly - 2 sets - 30 sec hold   GOALS: Goals reviewed with patient? Yes  SHORT TERM GOALS: Target date: 11/14/2021  Patient to be independent with initial HEP. Baseline: HEP initiated Goal status: IN PROGRESS    LONG TERM GOALS: Target date: 12/05/2021  Patient to be independent with advanced HEP. Baseline: Not yet initiated  Goal status: IN PROGRESS  Patient to demonstrate alternating reciprocal pattern when ascending and descending stairs with good stability and 1 handrail as needed.   Baseline: step-to pattern Goal status: IN PROGRESS  Patient to score at least 24/30 on FGA in order to decrease risk of falls.  Baseline: 20/30 Goal status: IN PROGRESS  Patient to demonstrate gait speed of 3.44 ft/sec in order to meet MCID. Baseline: 2.62 ft/sec Goal status: INITIAL  Patient to demonstrate 5xSTS test in <15 sec and with good eccentric control in order to demonstrate improved functional strength. Baseline: poor eccentric control  Goal status: IN PROGRESS   ASSESSMENT:  CLINICAL IMPRESSION: Patient arrived to session with report of improved understanding and tolerance of HEP. Continued working on STS with focus on increased R LE use; today using mirror and manual feedback. Hip strengthening activities revealed R>L weakness but with good effort on patient's part to proceed despite muscle fatigue. Some hesitancy demonstrated with balance tasks, requiring continued practice. No complaints at end of session.     OBJECTIVE IMPAIRMENTS Abnormal gait, decreased activity tolerance, decreased balance, difficulty walking, decreased strength, increased muscle spasms, and impaired tone.   ACTIVITY LIMITATIONS carrying, lifting, squatting, stairs, and transfers  PARTICIPATION LIMITATIONS: meal  prep, cleaning, laundry, shopping, community activity, yard work, and church  PERSONAL FACTORS Age, Fitness, Past/current experiences, Time since onset of injury/illness/exacerbation, and 3+ comorbidities: asthma, MS, R patellar dislocation  are also affecting patient's functional outcome.   REHAB POTENTIAL: Good  CLINICAL DECISION MAKING: Evolving/moderate complexity  EVALUATION COMPLEXITY: Moderate  PLAN: PT FREQUENCY: 2x/week  PT DURATION: 6 weeks  PLANNED INTERVENTIONS: Therapeutic exercises, Therapeutic activity, Neuromuscular re-education, Balance training, Gait training, Patient/Family education, Self Care, Joint mobilization, Stair training, Vestibular training, Canalith repositioning, Aquatic Therapy, Dry Needling, Electrical stimulation, Cryotherapy, Moist heat, Taping, Manual therapy, and Re-evaluation  PLAN FOR NEXT SESSION: progress R quad and hip strength, high level balance activities    Janene Harvey, PT, DPT 10/31/21 8:53 AM  [facility] at [facility] [address] [place],  [address] Phone # [phone] Fax # [phone]

## 2021-10-31 ENCOUNTER — Ambulatory Visit: Payer: BC Managed Care – PPO | Admitting: Physical Therapy

## 2021-10-31 ENCOUNTER — Encounter: Payer: Self-pay | Admitting: Physical Therapy

## 2021-10-31 DIAGNOSIS — R2689 Other abnormalities of gait and mobility: Secondary | ICD-10-CM

## 2021-10-31 DIAGNOSIS — R2681 Unsteadiness on feet: Secondary | ICD-10-CM

## 2021-10-31 DIAGNOSIS — M6281 Muscle weakness (generalized): Secondary | ICD-10-CM

## 2021-11-04 NOTE — Therapy (Signed)
OUTPATIENT PHYSICAL THERAPY NEURO TREATMENT   Patient Name: Tara Ross MRN: [id number] DOB:Mar 25, 1960, 61 y.o., female Today's Date: 11/05/2021   PCP: [facility]  REFERRING PROVIDER: Ala Bent, MD   PT End of Session - 11/05/21 1616     Visit Number 4    Number of Visits 13    Date for PT Re-Evaluation 12/05/21    Authorization Type BCBS    PT Start Time 1533    PT Stop Time 1614    PT Time Calculation (min) 41 min    Equipment Utilized During Treatment Gait belt    Activity Tolerance Patient tolerated treatment well    Behavior During Therapy WFL for tasks assessed/performed                Past Medical History:  Diagnosis Date   Asthma    Basal cell carcinoma of scalp and skin of face    Cataract    Neuromuscular disorder [facility])    Past Surgical History:  Procedure Laterality Date   AUGMENTATION MAMMAPLASTY Bilateral    Patient Active Problem List   Diagnosis Date Noted   Foot pain, bilateral 02/20/2021    ONSET DATE: Last MS exacerbation in Spring 2023  REFERRING DIAG: G35 (ICD-10-CM) - Multiple sclerosis  THERAPY DIAG:  Muscle weakness (generalized)  Unsteadiness on feet  Other abnormalities of gait and mobility  Rationale for Evaluation and Treatment Rehabilitation  SUBJECTIVE:                                                                                                                                                                                              SUBJECTIVE STATEMENT: The sidestepping with the band makes my knee sore.  Pt accompanied by: self  PERTINENT HISTORY: asthma, MS; pt reports chronic R patellar dislocation   PAIN:  Are you having pain? No  PRECAUTIONS: Fall  PATIENT GOALS improve balance and R LE strength   OBJECTIVE:     TODAY'S TREATMENT: 11/05/21 Activity Comments  Bike L3 x 5 min   Gait training with 1/2" heel wedge. 3/4", and no heel wedge in R shoe Scissoring  and increased M/L sway; slightly improved R knee flexion during swing; report of most comfort with 1/2" wedge compared to 3/4"  single leg STS to elevated mat; changed to STS with R single leg eccentric lower with B UE support  Report of difficulty d/t R knee   Sidestepping with red loop and yellow loop around forefoot along counter top  Lateral trunk lean which improved with yellow TB; also report of improved knee pain with yellow  Walking with EC 4x along counter top Cues for wide BOS  backwards walk 4x along counter top Cues for wide BOS  Standing on foam EO/EC and EC with head turns/nods Tendency for posterior sway  fwd/back stepping 15x  Starting with 1 UE support, weaning to none with CGA-min A; cues to widen BOS    PATIENT EDUCATION: Education details: educated on use of heel wedge with ambulation; administered yellow TB for sidestepping  Person educated: Patient Education method: Customer service manager Education comprehension: verbalized understanding and returned demonstration   HOME EXERCISE PROGRAM Last updated: 10/31/21 Access Code: [email or web address] URL: [email or web address] Date: 10/31/2021 Prepared by: [facility]  Program Notes perform at a counter top or corner for safety  Exercises - Standing Terminal Knee Extension with Resistance  - 1 x daily - 5 x weekly - 2 sets - 10 reps - Sit to Stand with Arms Crossed  - 1 x daily - 5 x weekly - 2 sets - 10 reps - Sidelying Hip Abduction  - 1 x daily - 5 x weekly - 2 sets - 10 reps - Tandem Walking with Counter Support  - 1 x daily - 5 x weekly - 2 sets - 10 reps - Backward Walking with Counter Support  - 1 x daily - 5 x weekly - 2 sets - 10 reps - Romberg Stance with Eyes Closed  - 1 x daily - 5 x weekly - 2 sets - 30 sec hold - Side Stepping with Resistance at Feet  - 1 x daily - 5 x weekly - 2 sets - 10 reps   Below measures were taken at time of initial evaluation  unless otherwise specified:  DIAGNOSTIC FINDINGS: 08/12/21 brain MRI: No specific evidence of progressive demyelinating disease. No acute abnormality or active demyelination identified.  COGNITION: Overall cognitive status: Within functional limits for tasks assessed   SENSATION: WFL  COORDINATION: B Alt pron/supination intact; finger to nose intact; heel to shin intact   MUSCLE TONE: LLE: Mild and in L quad   POSTURE: No Significant postural limitations  LOWER EXTREMITY ROM:     Active  Right Eval Left Eval  Hip flexion    Hip extension    Hip abduction    Hip adduction    Hip internal rotation    Hip external rotation    Knee flexion    Knee extension    Ankle dorsiflexion 16 14  Ankle plantarflexion    Ankle inversion    Ankle eversion     (Blank rows = not tested)  LOWER EXTREMITY MMT:    MMT Right Eval Left Eval  Hip flexion 4 5  Hip extension    Hip abduction 4 4  Hip adduction 4+ 4+  Hip internal rotation    Hip external rotation    Knee flexion 4 4+  Knee extension 4 4+  Ankle dorsiflexion 4+ 4+  Ankle plantarflexion 4 (completed 20 reps with heavy anterior trunk lean) 4 (completed 20 reps with heavy anterior trunk lean)  Ankle inversion    Ankle eversion    (Blank rows = not tested)   GAIT: Gait pattern:  decreased L step length and R LE with reduced knee flexion through swing  Level of assistance: Complete Independence   PATIENT EDUCATION: Education details: exam findings, prognosis, POC, HEP Person educated: Patient Education method: Explanation, Demonstration, Tactile cues, Verbal cues, and Handouts Education comprehension: verbalized understanding and returned demonstration   HOME  EXERCISE PROGRAM: Access Code: [email or web address] URL: [email or web address] Date: 10/24/2021 Prepared by: [facility]  Program Notes perform at a counter top or corner for safety  Exercises - Standing  Terminal Knee Extension with Resistance  - 1 x daily - 5 x weekly - 2 sets - 10 reps - Sit to Stand with Arms Crossed  - 1 x daily - 5 x weekly - 2 sets - 10 reps - Sidelying Hip Abduction  - 1 x daily - 5 x weekly - 2 sets - 10 reps - Tandem Walking with Counter Support  - 1 x daily - 5 x weekly - 2 sets - 10 reps - Backward Walking with Counter Support  - 1 x daily - 5 x weekly - 2 sets - 10 reps - Romberg Stance with Eyes Closed  - 1 x daily - 5 x weekly - 2 sets - 30 sec hold   GOALS: Goals reviewed with patient? Yes  SHORT TERM GOALS: Target date: 11/14/2021  Patient to be independent with initial HEP. Baseline: HEP initiated Goal status: IN PROGRESS    LONG TERM GOALS: Target date: 12/05/2021  Patient to be independent with advanced HEP. Baseline: Not yet initiated  Goal status: IN PROGRESS  Patient to demonstrate alternating reciprocal pattern when ascending and descending stairs with good stability and 1 handrail as needed.   Baseline: step-to pattern Goal status: IN PROGRESS  Patient to score at least 24/30 on FGA in order to decrease risk of falls.  Baseline: 20/30 Goal status: IN PROGRESS  Patient to demonstrate gait speed of 3.44 ft/sec in order to meet MCID. Baseline: 2.62 ft/sec Goal status: INITIAL  Patient to demonstrate 5xSTS test in <15 sec and with good eccentric control in order to demonstrate improved functional strength. Baseline: poor eccentric control  Goal status: IN PROGRESS   ASSESSMENT:  CLINICAL IMPRESSION: Patient arrived to session with report of some R knee soreness from sidestepping exercise. Adjusted band resistance which resulted in improved form and tolerance. Trialed gait training with heel wedge in R shoe which did improve tendency to bend R knee during swing phase. Balance activities required cues to widen BOS which reduced tendency for scissoring and imbalance. Patient tolerated session well and without complaints upon leaving.      OBJECTIVE IMPAIRMENTS Abnormal gait, decreased activity tolerance, decreased balance, difficulty walking, decreased strength, increased muscle spasms, and impaired tone.   ACTIVITY LIMITATIONS carrying, lifting, squatting, stairs, and transfers  PARTICIPATION LIMITATIONS: meal prep, cleaning, laundry, shopping, community activity, yard work, and church  PERSONAL FACTORS Age, Fitness, Past/current experiences, Time since onset of injury/illness/exacerbation, and 3+ comorbidities: asthma, MS, R patellar dislocation  are also affecting patient's functional outcome.   REHAB POTENTIAL: Good  CLINICAL DECISION MAKING: Evolving/moderate complexity  EVALUATION COMPLEXITY: Moderate  PLAN: PT FREQUENCY: 2x/week  PT DURATION: 6 weeks  PLANNED INTERVENTIONS: Therapeutic exercises, Therapeutic activity, Neuromuscular re-education, Balance training, Gait training, Patient/Family education, Self Care, Joint mobilization, Stair training, Vestibular training, Canalith repositioning, Aquatic Therapy, Dry Needling, Electrical stimulation, Cryotherapy, Moist heat, Taping, Manual therapy, and Re-evaluation  PLAN FOR NEXT SESSION: progress R quad and hip strength, high level balance activities    Janene Harvey, PT, DPT 11/05/21 4:17 PM  [facility] [address] [place], [place] [address] Phone # [phone] Fax # [phone]

## 2021-11-05 ENCOUNTER — Encounter: Payer: Self-pay | Admitting: Physical Therapy

## 2021-11-05 ENCOUNTER — Ambulatory Visit: Payer: BC Managed Care – PPO | Admitting: Physical Therapy

## 2021-11-05 DIAGNOSIS — R2689 Other abnormalities of gait and mobility: Secondary | ICD-10-CM

## 2021-11-05 DIAGNOSIS — R2681 Unsteadiness on feet: Secondary | ICD-10-CM | POA: Diagnosis not present

## 2021-11-05 DIAGNOSIS — M6281 Muscle weakness (generalized): Secondary | ICD-10-CM | POA: Diagnosis not present

## 2021-11-06 ENCOUNTER — Ambulatory Visit (INDEPENDENT_AMBULATORY_CARE_PROVIDER_SITE_OTHER): Payer: BC Managed Care – PPO | Admitting: Sports Medicine

## 2021-11-06 VITALS — BP 125/59 | Ht 62.0 in

## 2021-11-06 DIAGNOSIS — M25361 Other instability, right knee: Secondary | ICD-10-CM

## 2021-11-06 DIAGNOSIS — M76829 Posterior tibial tendinitis, unspecified leg: Secondary | ICD-10-CM

## 2021-11-06 NOTE — Progress Notes (Signed)
  Tara Ross - 61 y.o. female MRN [id number]  Date of birth: April 20, 1960    CHIEF COMPLAINT:   R foot pain    SUBJECTIVE:   HPI:  Patient is a pleasant 61 year old female who presents to clinic for right foot pain.  Her pain is located over the medial aspect of the foot near the base of the first metatarsal posterior to the medial malleolus.  This has been going on for a number of weeks now.  She says she feels her right lower extremity internally rotated and pronating whenever she walks.  She walks every day and does Pilates a couple times a week.  She has orthotics that she got from this clinic here in December 2022.  She has been wearing these daily in her tennis shoes.  Of note, she has a history of hypermobility, significant right knee patellar instability with numerous subluxation events.  She has been deferring knee surgery for this.  ROS:     See HPI  PERTINENT  PMH / PSH FH / / SH:  Past Medical, Surgical, Social, and Family History Reviewed & Updated in the EMR.  Pertinent findings include:  Chronic right knee patellar instability  OBJECTIVE: BP (!) 125/59   Ht '5\' 2"'$  (1.575 m)   BMI 18.88 kg/m   Physical Exam:  Vital signs are reviewed.  GEN: Alert and oriented, NAD Pulm: Breathing unlabored PSY: normal mood, congruent affect  MSK: RLE -on inspection there is obvious medial joint line hypertrophy of the right knee and valgus alignment.  At the ankle she has slight calcaneal valgus with a too many toes sign.  She is nontender to palpation at the medial lateral joint line of the knee.  She is tender to palpation posterior to the medial malleolus and at the base of the first metatarsal and proximally up the distal leg on the medial side.  On toe raise, the right heel does not internally rotate symmetrically compared to the left.  Full range of motion of the knee and ankle.  5/5 strength in knee and ankle.  Symmetric anterior drawer and talar tilt tests bilaterally. Defers  patellar apprehension test due to concern it might sublux.  Gait Analysis: Right lower extremity internally rotated and pronated.  The right knee in valgus alignment.  ASSESSMENT & PLAN:  1. R Posterior Tibialis Dysfunction 2. R Patellar Instability -Today we added a medial heel wedge to her current orthotics which are still in good condition.  Her gait was analyzed afterwards and the pronation and internal rotation were improved to a certain degree.  We also gave her a compression sleeve for the right knee to help support medially to decrease some of the valgus alignment.  She will perform home strengthening exercises for posterior tibialis.  She will continue walking as tolerated.  She will follow-up in about 4 to 6 weeks.  If no improvement, would recommend custom orthotics at that time or look into other ways to compensate for her valgus alignment.  Dortha Kern, MD PGY-4, Sports Medicine Fellow [place]  I observed and examined the patient with the resident and agree with assessment and plan.  Note reviewed and modified by me. Ila Mcgill, MD

## 2021-11-07 ENCOUNTER — Encounter: Payer: Self-pay | Admitting: Physical Therapy

## 2021-11-07 ENCOUNTER — Ambulatory Visit: Payer: BC Managed Care – PPO | Admitting: Physical Therapy

## 2021-11-07 DIAGNOSIS — M6281 Muscle weakness (generalized): Secondary | ICD-10-CM | POA: Diagnosis not present

## 2021-11-07 DIAGNOSIS — R2689 Other abnormalities of gait and mobility: Secondary | ICD-10-CM | POA: Diagnosis not present

## 2021-11-07 DIAGNOSIS — R2681 Unsteadiness on feet: Secondary | ICD-10-CM

## 2021-11-07 NOTE — Therapy (Signed)
OUTPATIENT PHYSICAL THERAPY NEURO TREATMENT   Patient Name: Tara Ross MRN: [id number] DOB:[birth year], 61 y.o., female Today's Date: 11/07/2021   PCP: [facility]  REFERRING PROVIDER: Ala Bent, MD   PT End of Session - 11/07/21 [phone]     Visit Number 5    Number of Visits 13    Date for PT Re-Evaluation 12/05/21    Authorization Type BCBS    PT Start Time 0801    PT Stop Time 0842    PT Time Calculation (min) 41 min    Equipment Utilized During Treatment Gait belt    Activity Tolerance Patient tolerated treatment well    Behavior During Therapy WFL for tasks assessed/performed                 Past Medical History:  Diagnosis Date   Asthma    Basal cell carcinoma of scalp and skin of face    Cataract    Neuromuscular disorder [facility]    Past Surgical History:  Procedure Laterality Date   AUGMENTATION MAMMAPLASTY Bilateral    Patient Active Problem List   Diagnosis Date Noted   Foot pain, bilateral 02/20/2021    ONSET DATE: Last MS exacerbation in Spring 2023  REFERRING DIAG: G35 (ICD-10-CM) - Multiple sclerosis  THERAPY DIAG:  Muscle weakness (generalized)  Unsteadiness on feet  Other abnormalities of gait and mobility  Rationale for Evaluation and Treatment Rehabilitation  SUBJECTIVE:                                                                                                                                                                                              SUBJECTIVE STATEMENT: Saw Dr. Oneida Alar who made her custom orthotics and was told that her ankle is weak and needs to work on this.  Pt accompanied by: self  PERTINENT HISTORY: asthma, MS; pt reports chronic R patellar dislocation   PAIN:  Are you having pain? No  PRECAUTIONS: Fall  PATIENT GOALS improve balance and R LE strength   OBJECTIVE:     TODAY'S TREATMENT: 11/07/21 Activity Comments  Bike L3 x 6 min   Palpation  TTP along R  posterior tib  R ankle inversion/eversion with red TB 2x10 Edu on avoiding compensations and ways to perform independently at home   R Seated arch contractions 10x   Heel raises with tennis ball b/w heels 2x10   bridge + DF 2x10 Cues to maintain hip ABD  Prone R HS curl 3# 10x   Focusing on eccentric control        LOWER EXTREMITY MMT:    MMT  Right  Left   Ankle dorsiflexion 4+ 4+  Ankle plantarflexion    Ankle inversion 3+ 4+  Ankle eversion 4- 4  (Blank rows = not tested)   HOME EXERCISE PROGRAM Last updated: 11/07/21 Access Code: [email or web address] Access Code: [email or web address] URL: [email or web address] Date: 11/07/2021 Prepared by: [facility]  Program Notes perform at a counter top or corner for safety  Exercises - Standing Terminal Knee Extension with Resistance  - 1 x daily - 5 x weekly - 2 sets - 10 reps - Sit to Stand with Arms Crossed  - 1 x daily - 5 x weekly - 2 sets - 10 reps - Sidelying Hip Abduction  - 1 x daily - 5 x weekly - 2 sets - 10 reps - Tandem Walking with Counter Support  - 1 x daily - 5 x weekly - 2 sets - 10 reps - Backward Walking with Counter Support  - 1 x daily - 5 x weekly - 2 sets - 10 reps - Romberg Stance with Eyes Closed  - 1 x daily - 5 x weekly - 2 sets - 30 sec hold - Side Stepping with Resistance at Feet  - 1 x daily - 5 x weekly - 2 sets - 10 reps - Ankle Inversion with Resistance  - 1 x daily - 5 x weekly - 2 sets - 10 reps - Ankle Eversion with Resistance  - 1 x daily - 5 x weekly - 2 sets - 10 reps - Seated Arch Lifts  - 1 x daily - 5 x weekly - 2 sets - 10 reps - Standing Calf Raise With Small Ball at Heels  - 1 x daily - 5 x weekly - 2 sets - 10 reps    PATIENT EDUCATION: Education details: HEP update Person educated: Patient Education method: Explanation, Demonstration, Tactile cues, Verbal cues, and Handouts Education comprehension: verbalized understanding and returned  demonstration   Below measures were taken at time of initial evaluation unless otherwise specified:  DIAGNOSTIC FINDINGS: 08/12/21 brain MRI: No specific evidence of progressive demyelinating disease. No acute abnormality or active demyelination identified.  COGNITION: Overall cognitive status: Within functional limits for tasks assessed   SENSATION: WFL  COORDINATION: B Alt pron/supination intact; finger to nose intact; heel to shin intact   MUSCLE TONE: LLE: Mild and in L quad   POSTURE: No Significant postural limitations  LOWER EXTREMITY ROM:     Active  Right Eval Left Eval  Hip flexion    Hip extension    Hip abduction    Hip adduction    Hip internal rotation    Hip external rotation    Knee flexion    Knee extension    Ankle dorsiflexion 16 14  Ankle plantarflexion    Ankle inversion    Ankle eversion     (Blank rows = not tested)  LOWER EXTREMITY MMT:    MMT Right Eval Left Eval  Hip flexion 4 5  Hip extension    Hip abduction 4 4  Hip adduction 4+ 4+  Hip internal rotation    Hip external rotation    Knee flexion 4 4+  Knee extension 4 4+  Ankle dorsiflexion 4+ 4+  Ankle plantarflexion 4 (completed 20 reps with heavy anterior trunk lean) 4 (completed 20 reps with heavy anterior trunk lean)  Ankle inversion    Ankle eversion    (Blank rows = not tested)   GAIT: Gait pattern:  decreased L step length and R LE with reduced knee flexion through swing  Level of assistance: Complete Independence   PATIENT EDUCATION: Education details: exam findings, prognosis, POC, HEP Person educated: Patient Education method: Explanation, Demonstration, Tactile cues, Verbal cues, and Handouts Education comprehension: verbalized understanding and returned demonstration   HOME EXERCISE PROGRAM: Access Code: [email or web address] URL: [email or web address] Date: 10/24/2021 Prepared by: [facility]  Program  Notes perform at a counter top or corner for safety  Exercises - Standing Terminal Knee Extension with Resistance  - 1 x daily - 5 x weekly - 2 sets - 10 reps - Sit to Stand with Arms Crossed  - 1 x daily - 5 x weekly - 2 sets - 10 reps - Sidelying Hip Abduction  - 1 x daily - 5 x weekly - 2 sets - 10 reps - Tandem Walking with Counter Support  - 1 x daily - 5 x weekly - 2 sets - 10 reps - Backward Walking with Counter Support  - 1 x daily - 5 x weekly - 2 sets - 10 reps - Romberg Stance with Eyes Closed  - 1 x daily - 5 x weekly - 2 sets - 30 sec hold   GOALS: Goals reviewed with patient? Yes  SHORT TERM GOALS: Target date: 11/14/2021  Patient to be independent with initial HEP. Baseline: HEP initiated Goal status: IN PROGRESS    LONG TERM GOALS: Target date: 12/05/2021  Patient to be independent with advanced HEP. Baseline: Not yet initiated  Goal status: IN PROGRESS  Patient to demonstrate alternating reciprocal pattern when ascending and descending stairs with good stability and 1 handrail as needed.   Baseline: step-to pattern Goal status: IN PROGRESS  Patient to score at least 24/30 on FGA in order to decrease risk of falls.  Baseline: 20/30 Goal status: IN PROGRESS  Patient to demonstrate gait speed of 3.44 ft/sec in order to meet MCID. Baseline: 2.62 ft/sec Goal status: INITIAL  Patient to demonstrate 5xSTS test in <15 sec and with good eccentric control in order to demonstrate improved functional strength. Baseline: poor eccentric control  Goal status: IN PROGRESS   ASSESSMENT:  CLINICAL IMPRESSION: Patient arrived to session with report of getting new custom orthotics. Upon observation, orthotic includes a medial heel wedge for improved pronation control. Worked on intrinsic foot and ankle strengthening to address weakness evident with MMT today. Proceeded with HS strengthening with focus on proper positioning and controlled eccentric phase. Patient tolerated  session well and without complaints upon leaving.     OBJECTIVE IMPAIRMENTS Abnormal gait, decreased activity tolerance, decreased balance, difficulty walking, decreased strength, increased muscle spasms, and impaired tone.   ACTIVITY LIMITATIONS carrying, lifting, squatting, stairs, and transfers  PARTICIPATION LIMITATIONS: meal prep, cleaning, laundry, shopping, community activity, yard work, and church  PERSONAL FACTORS Age, Fitness, Past/current experiences, Time since onset of injury/illness/exacerbation, and 3+ comorbidities: asthma, MS, R patellar dislocation  are also affecting patient's functional outcome.   REHAB POTENTIAL: Good  CLINICAL DECISION MAKING: Evolving/moderate complexity  EVALUATION COMPLEXITY: Moderate  PLAN: PT FREQUENCY: 2x/week  PT DURATION: 6 weeks  PLANNED INTERVENTIONS: Therapeutic exercises, Therapeutic activity, Neuromuscular re-education, Balance training, Gait training, Patient/Family education, Self Care, Joint mobilization, Stair training, Vestibular training, Canalith repositioning, Aquatic Therapy, Dry Needling, Electrical stimulation, Cryotherapy, Moist heat, Taping, Manual therapy, and Re-evaluation  PLAN FOR NEXT SESSION: progress R quad and hip strength, high level balance activities    Janene Harvey, PT, DPT 11/07/21  8:43 AM  [facility] at [facility] [place], [place], [place] [address] Phone # [phone] Fax # [phone]

## 2021-11-11 ENCOUNTER — Ambulatory Visit: Payer: BC Managed Care – PPO | Admitting: Physical Therapy

## 2021-11-12 DIAGNOSIS — G35 Multiple sclerosis: Secondary | ICD-10-CM | POA: Diagnosis not present

## 2021-11-12 DIAGNOSIS — Z0389 Encounter for observation for other suspected diseases and conditions ruled out: Secondary | ICD-10-CM | POA: Diagnosis not present

## 2021-11-12 NOTE — Therapy (Signed)
OUTPATIENT PHYSICAL THERAPY NEURO TREATMENT   Patient Name: Tara Ross MRN: [id number] DOB:16-Dec-1960, 61 y.o., female Today's Date: 11/14/2021   PCP: [facility]  REFERRING PROVIDER: Ala Bent, MD   PT End of Session - 11/14/21 [phone]     Visit Number 6    Number of Visits 13    Date for PT Re-Evaluation 12/05/21    Authorization Type BCBS    PT Start Time 0800    PT Stop Time 0843    PT Time Calculation (min) 43 min    Equipment Utilized During Treatment Gait belt    Activity Tolerance Patient tolerated treatment well    Behavior During Therapy WFL for tasks assessed/performed                  Past Medical History:  Diagnosis Date   Asthma    Basal cell carcinoma of scalp and skin of face    Cataract    Neuromuscular disorder [facility])    Past Surgical History:  Procedure Laterality Date   AUGMENTATION MAMMAPLASTY Bilateral    Patient Active Problem List   Diagnosis Date Noted   Foot pain, bilateral 02/20/2021    ONSET DATE: Last MS exacerbation in Spring 2023  REFERRING DIAG: G35 (ICD-10-CM) - Multiple sclerosis  THERAPY DIAG:  Muscle weakness (generalized)  Unsteadiness on feet  Other abnormalities of gait and mobility  Rationale for Evaluation and Treatment Rehabilitation  SUBJECTIVE:                                                                                                                                                                                              SUBJECTIVE STATEMENT: Not much new. Was down on her knees working in the yard and the R knee is a little sore the past couple of days.  Pt accompanied by: self  PERTINENT HISTORY: asthma, MS; pt reports chronic R patellar dislocation   PAIN:  Are you having pain? Yes: NPRS scale: 2/10 Pain location: R knee Pain description: sore Aggravating factors: extending knee Relieving factors: rest  PRECAUTIONS: Fall  PATIENT GOALS improve  balance and R LE strength   OBJECTIVE:      TODAY'S TREATMENT: 11/14/21 Activity Comments  Bike L3 x 6 min   SLR 2# 2x10 Slight quad lag; cues to contract quad each time   fire hydrant with yellow TB 2x10 each LE Manual assist to maintain spine neutral and maintain wider BOS  Supine R butterfly stretch 30"   Supine R fig 4 stretch 30"   R Mod thomas stretch 2x30"   Staggered R/L anterior/posterior wt  shifts with mirror support 15x each Occasional min A d/t imbalance   Feet wide side to side wt shift with dip in the middle with mirror support 15x each Improved stability       HOME EXERCISE PROGRAM Last updated: 11/13/21 Access Code: [email or web address] URL: [email or web address] Date: 11/14/2021 Prepared by: [facility]  Program Notes perform at a counter top or corner for safety  Exercises - Standing Terminal Knee Extension with Resistance  - 1 x daily - 5 x weekly - 2 sets - 10 reps - Sit to Stand with Arms Crossed  - 1 x daily - 5 x weekly - 2 sets - 10 reps - Sidelying Hip Abduction  - 1 x daily - 5 x weekly - 2 sets - 10 reps - Tandem Walking with Counter Support  - 1 x daily - 5 x weekly - 2 sets - 10 reps - Backward Walking with Counter Support  - 1 x daily - 5 x weekly - 2 sets - 10 reps - Romberg Stance with Eyes Closed  - 1 x daily - 5 x weekly - 2 sets - 30 sec hold - Side Stepping with Resistance at Feet  - 1 x daily - 5 x weekly - 2 sets - 10 reps - Ankle Inversion with Resistance  - 1 x daily - 5 x weekly - 2 sets - 10 reps - Ankle Eversion with Resistance  - 1 x daily - 5 x weekly - 2 sets - 10 reps - Seated Arch Lifts  - 1 x daily - 5 x weekly - 2 sets - 10 reps - Standing Calf Raise With Small Ball at Heels  - 1 x daily - 5 x weekly - 2 sets - 10 reps - Supine Figure 4 Piriformis Stretch  - 1 x daily - 5 x weekly - 2 sets - 30 sec hold - Modified Thomas Stretch  - 1 x daily - 5 x weekly - 2 sets - 30 sec hold -  Supine Butterfly Groin Stretch  - 1 x daily - 5 x weekly - 2 sets - 30 sec hold     PATIENT EDUCATION: Education details: HEP update; educated on benefits of KT tape Person educated: Patient Education method: Explanation, Demonstration, Tactile cues, Verbal cues, and Handouts Education comprehension: verbalized understanding and returned demonstration   Below measures were taken at time of initial evaluation unless otherwise specified:  DIAGNOSTIC FINDINGS: 08/12/21 brain MRI: No specific evidence of progressive demyelinating disease. No acute abnormality or active demyelination identified.  COGNITION: Overall cognitive status: Within functional limits for tasks assessed   SENSATION: WFL  COORDINATION: B Alt pron/supination intact; finger to nose intact; heel to shin intact   MUSCLE TONE: LLE: Mild and in L quad   POSTURE: No Significant postural limitations  LOWER EXTREMITY ROM:     Active  Right Eval Left Eval  Hip flexion    Hip extension    Hip abduction    Hip adduction    Hip internal rotation    Hip external rotation    Knee flexion    Knee extension    Ankle dorsiflexion 16 14  Ankle plantarflexion    Ankle inversion    Ankle eversion     (Blank rows = not tested)  LOWER EXTREMITY MMT:    MMT Right Eval Left Eval  Hip flexion 4 5  Hip extension    Hip abduction 4 4  Hip  adduction 4+ 4+  Hip internal rotation    Hip external rotation    Knee flexion 4 4+  Knee extension 4 4+  Ankle dorsiflexion 4+ 4+  Ankle plantarflexion 4 (completed 20 reps with heavy anterior trunk lean) 4 (completed 20 reps with heavy anterior trunk lean)  Ankle inversion    Ankle eversion    (Blank rows = not tested)   GAIT: Gait pattern:  decreased L step length and R LE with reduced knee flexion through swing  Level of assistance: Complete Independence   PATIENT EDUCATION: Education details: exam findings, prognosis, POC, HEP Person educated:  Patient Education method: Explanation, Demonstration, Tactile cues, Verbal cues, and Handouts Education comprehension: verbalized understanding and returned demonstration   HOME EXERCISE PROGRAM: Access Code: [email or web address] URL: [email or web address] Date: 10/24/2021 Prepared by: [facility]  Program Notes perform at a counter top or corner for safety  Exercises - Standing Terminal Knee Extension with Resistance  - 1 x daily - 5 x weekly - 2 sets - 10 reps - Sit to Stand with Arms Crossed  - 1 x daily - 5 x weekly - 2 sets - 10 reps - Sidelying Hip Abduction  - 1 x daily - 5 x weekly - 2 sets - 10 reps - Tandem Walking with Counter Support  - 1 x daily - 5 x weekly - 2 sets - 10 reps - Backward Walking with Counter Support  - 1 x daily - 5 x weekly - 2 sets - 10 reps - Romberg Stance with Eyes Closed  - 1 x daily - 5 x weekly - 2 sets - 30 sec hold   GOALS: Goals reviewed with patient? Yes  SHORT TERM GOALS: Target date: 11/14/2021  Patient to be independent with initial HEP. Baseline: HEP initiated Goal status: MET    LONG TERM GOALS: Target date: 12/05/2021  Patient to be independent with advanced HEP. Baseline: Not yet initiated  Goal status: IN PROGRESS  Patient to demonstrate alternating reciprocal pattern when ascending and descending stairs with good stability and 1 handrail as needed.   Baseline: step-to pattern Goal status: IN PROGRESS  Patient to score at least 24/30 on FGA in order to decrease risk of falls.  Baseline: 20/30 Goal status: IN PROGRESS  Patient to demonstrate gait speed of 3.44 ft/sec in order to meet MCID. Baseline: 2.62 ft/sec Goal status: INITIAL  Patient to demonstrate 5xSTS test in <15 sec and with good eccentric control in order to demonstrate improved functional strength. Baseline: poor eccentric control  Goal status: IN PROGRESS   ASSESSMENT:  CLINICAL IMPRESSION: Patient arrived to  session with report of some R knee soreness after kneeling when working in the yard. Worked on increasing resistance with quad strengthening activities. Patient with mild quad lag evident with SLRs. Noted some stiffness in the R hip, thus worked on multidirectional stretching to address this with report of good relief. Balance activities revealed rigid posture and muscular co-contraction with mild instances of sway, thus worked on weight shifting activities to improve comfort within limits of stability. Patient tolerated session well and without complaints upon leaving.     OBJECTIVE IMPAIRMENTS Abnormal gait, decreased activity tolerance, decreased balance, difficulty walking, decreased strength, increased muscle spasms, and impaired tone.   ACTIVITY LIMITATIONS carrying, lifting, squatting, stairs, and transfers  PARTICIPATION LIMITATIONS: meal prep, cleaning, laundry, shopping, community activity, yard work, and church  PERSONAL FACTORS Age, Fitness, Past/current experiences, Time since onset of injury/illness/exacerbation,  and 3+ comorbidities: asthma, MS, R patellar dislocation  are also affecting patient's functional outcome.   REHAB POTENTIAL: Good  CLINICAL DECISION MAKING: Evolving/moderate complexity  EVALUATION COMPLEXITY: Moderate  PLAN: PT FREQUENCY: 2x/week  PT DURATION: 6 weeks  PLANNED INTERVENTIONS: Therapeutic exercises, Therapeutic activity, Neuromuscular re-education, Balance training, Gait training, Patient/Family education, Self Care, Joint mobilization, Stair training, Vestibular training, Canalith repositioning, Aquatic Therapy, Dry Needling, Electrical stimulation, Cryotherapy, Moist heat, Taping, Manual therapy, and Re-evaluation  PLAN FOR NEXT SESSION: progress R quad and hip strength, high level balance activities    Janene Harvey, PT, DPT 11/14/21 8:50 AM  [facility] at [facility] [address] [place], [place] [address] Phone # [phone] Fax # [phone]

## 2021-11-14 ENCOUNTER — Encounter: Payer: Self-pay | Admitting: Physical Therapy

## 2021-11-14 ENCOUNTER — Ambulatory Visit: Payer: BC Managed Care – PPO | Admitting: Physical Therapy

## 2021-11-14 DIAGNOSIS — R2681 Unsteadiness on feet: Secondary | ICD-10-CM

## 2021-11-14 DIAGNOSIS — M6281 Muscle weakness (generalized): Secondary | ICD-10-CM

## 2021-11-14 DIAGNOSIS — R2689 Other abnormalities of gait and mobility: Secondary | ICD-10-CM

## 2021-11-18 ENCOUNTER — Encounter: Payer: Self-pay | Admitting: Physical Therapy

## 2021-11-18 ENCOUNTER — Ambulatory Visit: Payer: BC Managed Care – PPO | Admitting: Physical Therapy

## 2021-11-18 DIAGNOSIS — R2681 Unsteadiness on feet: Secondary | ICD-10-CM | POA: Diagnosis not present

## 2021-11-18 DIAGNOSIS — M6281 Muscle weakness (generalized): Secondary | ICD-10-CM

## 2021-11-18 DIAGNOSIS — R2689 Other abnormalities of gait and mobility: Secondary | ICD-10-CM | POA: Diagnosis not present

## 2021-11-18 NOTE — Therapy (Signed)
OUTPATIENT PHYSICAL THERAPY NEURO TREATMENT   Patient Name: Tara Ross MRN: [id number] DOB:1961/03/13, 61 y.o., female Today's Date: 11/18/2021   PCP: [facility]  REFERRING PROVIDER: Ala Bent, MD   PT End of Session - 11/18/21 0844     Visit Number 7    Number of Visits 13    Date for PT Re-Evaluation 12/05/21    Authorization Type BCBS    PT Start Time 0803    PT Stop Time 0842    PT Time Calculation (min) 39 min    Equipment Utilized During Treatment Gait belt    Activity Tolerance Patient tolerated treatment well    Behavior During Therapy WFL for tasks assessed/performed                   Past Medical History:  Diagnosis Date   Asthma    Basal cell carcinoma of scalp and skin of face    Cataract    Neuromuscular disorder [facility])    Past Surgical History:  Procedure Laterality Date   AUGMENTATION MAMMAPLASTY Bilateral    Patient Active Problem List   Diagnosis Date Noted   Foot pain, bilateral 02/20/2021    ONSET DATE: Last MS exacerbation in Spring 2023  REFERRING DIAG: G35 (ICD-10-CM) - Multiple sclerosis  THERAPY DIAG:  Muscle weakness (generalized)  Unsteadiness on feet  Other abnormalities of gait and mobility  Rationale for Evaluation and Treatment Rehabilitation  SUBJECTIVE:                                                                                                                                                                                              SUBJECTIVE STATEMENT: Not much new. Husband came back from a trip recently. The ankle exercises made me "good sore." Pt accompanied by: self  PERTINENT HISTORY: asthma, MS; pt reports chronic R patellar dislocation   PAIN:  Are you having pain? No  PRECAUTIONS: Fall  PATIENT GOALS improve balance and R LE strength   OBJECTIVE:     TODAY'S TREATMENT: 11/18/21 Activity Comments  Scifit UE/Les L4 x 6 min   monster walks yellow TB  4x30 ft CGA; cues to maintain   lateral and posterior stepping over hurdle 15x each Cueing to exaggerate R hip flexion and increase step height; occasional UE support required   Resisted march 2x10 with yellow TB Cues to avoid posterior trunk lean  grapevine with CGA Good stability and coordination  step up + opposite  SKTC 10x each LE 1 UE support weaning to 2 finger support   Rockerboard static balance and ant/pos and R/L rocking  Good stability B directions         HOME EXERCISE PROGRAM Last updated: 11/18/21 Access Code: [email or web address] URL: [email or web address] Date: 11/18/2021 Prepared by: [facility]  Program Notes perform at a counter top or corner for safety  Exercises - Standing Terminal Knee Extension with Resistance  - 1 x daily - 5 x weekly - 2 sets - 10 reps - Sit to Stand with Arms Crossed  - 1 x daily - 5 x weekly - 2 sets - 10 reps - Sidelying Hip Abduction  - 1 x daily - 5 x weekly - 2 sets - 10 reps - Tandem Walking with Counter Support  - 1 x daily - 5 x weekly - 2 sets - 10 reps - Backward Walking with Counter Support  - 1 x daily - 5 x weekly - 2 sets - 10 reps - Romberg Stance with Eyes Closed  - 1 x daily - 5 x weekly - 2 sets - 30 sec hold - Side Stepping with Resistance at Feet  - 1 x daily - 5 x weekly - 2 sets - 10 reps - Ankle Inversion with Resistance  - 1 x daily - 5 x weekly - 2 sets - 10 reps - Ankle Eversion with Resistance  - 1 x daily - 5 x weekly - 2 sets - 10 reps - Seated Arch Lifts  - 1 x daily - 5 x weekly - 2 sets - 10 reps - Standing Calf Raise With Small Ball at Heels  - 1 x daily - 5 x weekly - 2 sets - 10 reps - Supine Figure 4 Piriformis Stretch  - 1 x daily - 5 x weekly - 2 sets - 30 sec hold - Modified Thomas Stretch  - 1 x daily - 5 x weekly - 2 sets - 30 sec hold - Supine Butterfly Groin Stretch  - 1 x daily - 5 x weekly - 2 sets - 30 sec hold - Marching with Resistance  - 1 x daily - 5  x weekly - 2 sets - 10 reps    PATIENT EDUCATION: Education details: HEP update Person educated: Patient Education method: Explanation, Demonstration, Tactile cues, Verbal cues, and Handouts Education comprehension: verbalized understanding and returned demonstration    Below measures were taken at time of initial evaluation unless otherwise specified:  DIAGNOSTIC FINDINGS: 08/12/21 brain MRI: No specific evidence of progressive demyelinating disease. No acute abnormality or active demyelination identified.  COGNITION: Overall cognitive status: Within functional limits for tasks assessed   SENSATION: WFL  COORDINATION: B Alt pron/supination intact; finger to nose intact; heel to shin intact   MUSCLE TONE: LLE: Mild and in L quad   POSTURE: No Significant postural limitations  LOWER EXTREMITY ROM:     Active  Right Eval Left Eval  Hip flexion    Hip extension    Hip abduction    Hip adduction    Hip internal rotation    Hip external rotation    Knee flexion    Knee extension    Ankle dorsiflexion 16 14  Ankle plantarflexion    Ankle inversion    Ankle eversion     (Blank rows = not tested)  LOWER EXTREMITY MMT:    MMT Right Eval Left Eval  Hip flexion 4 5  Hip extension    Hip abduction 4 4  Hip adduction 4+ 4+  Hip internal rotation    Hip external rotation  Knee flexion 4 4+  Knee extension 4 4+  Ankle dorsiflexion 4+ 4+  Ankle plantarflexion 4 (completed 20 reps with heavy anterior trunk lean) 4 (completed 20 reps with heavy anterior trunk lean)  Ankle inversion    Ankle eversion    (Blank rows = not tested)   GAIT: Gait pattern:  decreased L step length and R LE with reduced knee flexion through swing  Level of assistance: Complete Independence   PATIENT EDUCATION: Education details: exam findings, prognosis, POC, HEP Person educated: Patient Education method: Explanation, Demonstration, Tactile cues, Verbal cues, and  Handouts Education comprehension: verbalized understanding and returned demonstration   HOME EXERCISE PROGRAM: Access Code: [email or web address] URL: [email or web address] Date: 10/24/2021 Prepared by: [facility]  Program Notes perform at a counter top or corner for safety  Exercises - Standing Terminal Knee Extension with Resistance  - 1 x daily - 5 x weekly - 2 sets - 10 reps - Sit to Stand with Arms Crossed  - 1 x daily - 5 x weekly - 2 sets - 10 reps - Sidelying Hip Abduction  - 1 x daily - 5 x weekly - 2 sets - 10 reps - Tandem Walking with Counter Support  - 1 x daily - 5 x weekly - 2 sets - 10 reps - Backward Walking with Counter Support  - 1 x daily - 5 x weekly - 2 sets - 10 reps - Romberg Stance with Eyes Closed  - 1 x daily - 5 x weekly - 2 sets - 30 sec hold   GOALS: Goals reviewed with patient? Yes  SHORT TERM GOALS: Target date: 11/14/2021  Patient to be independent with initial HEP. Baseline: HEP initiated Goal status: MET    LONG TERM GOALS: Target date: 12/05/2021  Patient to be independent with advanced HEP. Baseline: Not yet initiated  Goal status: IN PROGRESS  Patient to demonstrate alternating reciprocal pattern when ascending and descending stairs with good stability and 1 handrail as needed.   Baseline: step-to pattern Goal status: IN PROGRESS  Patient to score at least 24/30 on FGA in order to decrease risk of falls.  Baseline: 20/30 Goal status: IN PROGRESS  Patient to demonstrate gait speed of 3.44 ft/sec in order to meet MCID. Baseline: 2.62 ft/sec Goal status: INITIAL  Patient to demonstrate 5xSTS test in <15 sec and with good eccentric control in order to demonstrate improved functional strength. Baseline: poor eccentric control  Goal status: IN PROGRESS   ASSESSMENT:  CLINICAL IMPRESSION: Patient arrived to session without new complaints. Worked on progressive hip strengthening activities as  well as balance exercises with intermittent cues for form. Patient tolerated multidirectional stepping and balance on compliant surfaces very well today. R hip and knee flexion limited with stepping and ambulation, thus focused on isolating these movements with and without resistance. Updated HEP with this exercise- patient reported understanding and without complaints at end of session.     OBJECTIVE IMPAIRMENTS Abnormal gait, decreased activity tolerance, decreased balance, difficulty walking, decreased strength, increased muscle spasms, and impaired tone.   ACTIVITY LIMITATIONS carrying, lifting, squatting, stairs, and transfers  PARTICIPATION LIMITATIONS: meal prep, cleaning, laundry, shopping, community activity, yard work, and church  PERSONAL FACTORS Age, Fitness, Past/current experiences, Time since onset of injury/illness/exacerbation, and 3+ comorbidities: asthma, MS, R patellar dislocation  are also affecting patient's functional outcome.   REHAB POTENTIAL: Good  CLINICAL DECISION MAKING: Evolving/moderate complexity  EVALUATION COMPLEXITY: Moderate  PLAN: PT FREQUENCY: 2x/week  PT DURATION: 6 weeks  PLANNED INTERVENTIONS: Therapeutic exercises, Therapeutic activity, Neuromuscular re-education, Balance training, Gait training, Patient/Family education, Self Care, Joint mobilization, Stair training, Vestibular training, Canalith repositioning, Aquatic Therapy, Dry Needling, Electrical stimulation, Cryotherapy, Moist heat, Taping, Manual therapy, and Re-evaluation  PLAN FOR NEXT SESSION: keep working on hip flexion and knee flexion strength; progress R quad and hip strength, high level balance activities;    Janene Harvey, PT, DPT 11/18/21 8:45 AM  [facility] at [facility] [address] [place], [place], [place] [address] Phone # [phone] Fax # [phone]

## 2021-11-21 ENCOUNTER — Ambulatory Visit: Payer: BC Managed Care – PPO | Admitting: Physical Therapy

## 2021-11-25 ENCOUNTER — Ambulatory Visit: Payer: BC Managed Care – PPO | Attending: Neurology | Admitting: Physical Therapy

## 2021-11-25 ENCOUNTER — Encounter: Payer: Self-pay | Admitting: Physical Therapy

## 2021-11-25 DIAGNOSIS — R2681 Unsteadiness on feet: Secondary | ICD-10-CM | POA: Insufficient documentation

## 2021-11-25 DIAGNOSIS — M6281 Muscle weakness (generalized): Secondary | ICD-10-CM | POA: Diagnosis not present

## 2021-11-25 DIAGNOSIS — R2689 Other abnormalities of gait and mobility: Secondary | ICD-10-CM | POA: Diagnosis not present

## 2021-11-25 NOTE — Therapy (Signed)
OUTPATIENT PHYSICAL THERAPY NEURO TREATMENT   Patient Name: Tara Ross MRN: [id number] DOB:[birth year], 61 y.o., female Today's Date: 11/25/2021   PCP: [facility]  REFERRING PROVIDER: Ala Bent, MD   PT End of Session - 11/25/21 0847     Visit Number 8    Number of Visits 13    Date for PT Re-Evaluation 12/05/21    Authorization Type BCBS    PT Start Time 0803    PT Stop Time 0845    PT Time Calculation (min) 42 min    Equipment Utilized During Treatment Gait belt    Activity Tolerance Patient tolerated treatment well    Behavior During Therapy WFL for tasks assessed/performed                    Past Medical History:  Diagnosis Date   Asthma    Basal cell carcinoma of scalp and skin of face    Cataract    Neuromuscular disorder [facility])    Past Surgical History:  Procedure Laterality Date   AUGMENTATION MAMMAPLASTY Bilateral    Patient Active Problem List   Diagnosis Date Noted   Foot pain, bilateral 02/20/2021    ONSET DATE: Last MS exacerbation in Spring 2023  REFERRING DIAG: G35 (ICD-10-CM) - Multiple sclerosis  THERAPY DIAG:  Muscle weakness (generalized)  Unsteadiness on feet  Other abnormalities of gait and mobility  Rationale for Evaluation and Treatment Rehabilitation  SUBJECTIVE:                                                                                                                                                                                              SUBJECTIVE STATEMENT: Not much new. Feeling like the R leg is stronger.  Pt accompanied by: self  PERTINENT HISTORY: MS; pt reports chronic R patellar dislocation   PAIN:  Are you having pain? No  PRECAUTIONS: Fall  PATIENT GOALS improve balance and R LE strength   OBJECTIVE:     TODAY'S TREATMENT: 11/25/21 Activity Comments  Scifit Ues/Les L4 x 6 min    fwd/back stepping on foam 15x Narrow BOS; cueing to contract core and  increase R knee flexion   fwd/back stepping + head nods to targets 15x Narrow BOS; cueing to contract core   toe tap to 3 cones Cues to shift onto standing LE and decrease speed  Wide feet + EC + head turns/nods 30" Mild-mod sway  Lateral step down + heel touch on 2 inch step 10x  Good knee alignment; c/o more challenge on R LE        HOME  EXERCISE PROGRAM Last updated: 11/25/21 Access Code: [email or web address] URL: [email or web address] Date: 11/25/2021 Prepared by: [facility]  Program Notes perform at a counter top or corner for safety  Exercises - Straight Leg Raise with Ankle Weight  - 1 x daily - 5 x weekly - 2 sets - 10 reps - Sidelying Hip Abduction  - 1 x daily - 5 x weekly - 2 sets - 10 reps - Tandem Walking with Counter Support  - 1 x daily - 5 x weekly - 2 sets - 10 reps - Side Stepping with Resistance at Feet  - 1 x daily - 5 x weekly - 2 sets - 10 reps - Ankle Inversion with Resistance  - 1 x daily - 5 x weekly - 2 sets - 10 reps - Ankle Eversion with Resistance  - 1 x daily - 5 x weekly - 2 sets - 10 reps - Seated Arch Lifts  - 1 x daily - 5 x weekly - 2 sets - 10 reps - Standing Calf Raise With Small Ball at Heels  - 1 x daily - 5 x weekly - 2 sets - 10 reps - Supine Figure 4 Piriformis Stretch  - 1 x daily - 5 x weekly - 2 sets - 30 sec hold - Modified Thomas Stretch  - 1 x daily - 5 x weekly - 2 sets - 30 sec hold - Supine Butterfly Groin Stretch  - 1 x daily - 5 x weekly - 2 sets - 30 sec hold - Marching with Resistance  - 1 x daily - 5 x weekly - 2 sets - 10 reps - Alternating Step Forward with Support  - 1 x daily - 5 x weekly - 2 sets - 10 reps - Lateral Step Down  - 1 x daily - 5 x weekly - 2 sets - 10 reps - Corner Balance Feet Apart: Eyes Closed With Head Turns  - 1 x daily - 5 x weekly - 2-3 sets - 30 se hold - Wide Stance with Eyes Closed and Head Nods  - 1 x daily - 5 x weekly - 2-3 sets - 30 sec  hold   PATIENT EDUCATION: Education details: HEP update and discussion on how to increase challenge with exercises  Person educated: Patient Education method: Explanation, Demonstration, Tactile cues, Verbal cues, and Handouts Education comprehension: verbalized understanding and returned demonstration    Below measures were taken at time of initial evaluation unless otherwise specified:  DIAGNOSTIC FINDINGS: 08/12/21 brain MRI: No specific evidence of progressive demyelinating disease. No acute abnormality or active demyelination identified.  COGNITION: Overall cognitive status: Within functional limits for tasks assessed   SENSATION: WFL  COORDINATION: B Alt pron/supination intact; finger to nose intact; heel to shin intact   MUSCLE TONE: LLE: Mild and in L quad   POSTURE: No Significant postural limitations  LOWER EXTREMITY ROM:     Active  Right Eval Left Eval  Hip flexion    Hip extension    Hip abduction    Hip adduction    Hip internal rotation    Hip external rotation    Knee flexion    Knee extension    Ankle dorsiflexion 16 14  Ankle plantarflexion    Ankle inversion    Ankle eversion     (Blank rows = not tested)  LOWER EXTREMITY MMT:    MMT Right Eval Left Eval  Hip flexion 4 5  Hip extension  Hip abduction 4 4  Hip adduction 4+ 4+  Hip internal rotation    Hip external rotation    Knee flexion 4 4+  Knee extension 4 4+  Ankle dorsiflexion 4+ 4+  Ankle plantarflexion 4 (completed 20 reps with heavy anterior trunk lean) 4 (completed 20 reps with heavy anterior trunk lean)  Ankle inversion    Ankle eversion    (Blank rows = not tested)   GAIT: Gait pattern:  decreased L step length and R LE with reduced knee flexion through swing  Level of assistance: Complete Independence   PATIENT EDUCATION: Education details: exam findings, prognosis, POC, HEP Person educated: Patient Education method: Explanation, Demonstration, Tactile  cues, Verbal cues, and Handouts Education comprehension: verbalized understanding and returned demonstration   HOME EXERCISE PROGRAM: Access Code: [email or web address] URL: [email or web address] Date: 10/24/2021 Prepared by: [facility]  Program Notes perform at a counter top or corner for safety  Exercises - Standing Terminal Knee Extension with Resistance  - 1 x daily - 5 x weekly - 2 sets - 10 reps - Sit to Stand with Arms Crossed  - 1 x daily - 5 x weekly - 2 sets - 10 reps - Sidelying Hip Abduction  - 1 x daily - 5 x weekly - 2 sets - 10 reps - Tandem Walking with Counter Support  - 1 x daily - 5 x weekly - 2 sets - 10 reps - Backward Walking with Counter Support  - 1 x daily - 5 x weekly - 2 sets - 10 reps - Romberg Stance with Eyes Closed  - 1 x daily - 5 x weekly - 2 sets - 30 sec hold   GOALS: Goals reviewed with patient? Yes  SHORT TERM GOALS: Target date: 11/14/2021  Patient to be independent with initial HEP. Baseline: HEP initiated Goal status: MET    LONG TERM GOALS: Target date: 12/05/2021  Patient to be independent with advanced HEP. Baseline: Not yet initiated  Goal status: IN PROGRESS  Patient to demonstrate alternating reciprocal pattern when ascending and descending stairs with good stability and 1 handrail as needed.   Baseline: step-to pattern Goal status: IN PROGRESS  Patient to score at least 24/30 on FGA in order to decrease risk of falls.  Baseline: 20/30 Goal status: IN PROGRESS  Patient to demonstrate gait speed of 3.44 ft/sec in order to meet MCID. Baseline: 2.62 ft/sec Goal status: INITIAL  Patient to demonstrate 5xSTS test in <15 sec and with good eccentric control in order to demonstrate improved functional strength. Baseline: poor eccentric control  Goal status: IN PROGRESS   ASSESSMENT:  CLINICAL IMPRESSION: Patient arrived to session without complaints. Worked on dynamic balance  activities with compliant surface and head movements for balance challenge. Patient required cues to exaggerate R knee flexion with stepping d/t tendency to hold R knee extended. Able to increase challenge with HEP as patient notes increasing ease. Step downs were performed with good knee alignment and minimal compensations. Patient reported understanding of all edu provided and without complaints at end of session.      OBJECTIVE IMPAIRMENTS Abnormal gait, decreased activity tolerance, decreased balance, difficulty walking, decreased strength, increased muscle spasms, and impaired tone.   ACTIVITY LIMITATIONS carrying, lifting, squatting, stairs, and transfers  PARTICIPATION LIMITATIONS: meal prep, cleaning, laundry, shopping, community activity, yard work, and church  PERSONAL FACTORS Age, Fitness, Past/current experiences, Time since onset of injury/illness/exacerbation, and 3+ comorbidities: asthma, MS, R patellar dislocation  are also affecting patient's functional outcome.   REHAB POTENTIAL: Good  CLINICAL DECISION MAKING: Evolving/moderate complexity  EVALUATION COMPLEXITY: Moderate  PLAN: PT FREQUENCY: 2x/week  PT DURATION: 6 weeks  PLANNED INTERVENTIONS: Therapeutic exercises, Therapeutic activity, Neuromuscular re-education, Balance training, Gait training, Patient/Family education, Self Care, Joint mobilization, Stair training, Vestibular training, Canalith repositioning, Aquatic Therapy, Dry Needling, Electrical stimulation, Cryotherapy, Moist heat, Taping, Manual therapy, and Re-evaluation  PLAN FOR NEXT SESSION: keep working on hip flexion and knee flexion strength; progress R quad and hip strength, high level balance activities;    Janene Harvey, PT, DPT 11/25/21 8:50 AM  [facility] [address] [place], [place], [place] [address] Phone # [phone] Fax # [phone]

## 2021-11-27 NOTE — Therapy (Signed)
OUTPATIENT PHYSICAL THERAPY NEURO TREATMENT   Patient Name: Tara Ross MRN: [id number] DOB:Jun 26, 1960, 61 y.o., female Today's Date: 11/28/2021   PCP: [facility]  REFERRING PROVIDER: Ala Bent, MD   PT End of Session - 11/28/21 0843     Visit Number 9    Number of Visits 13    Date for PT Re-Evaluation 12/05/21    Authorization Type BCBS    PT Start Time 0800    PT Stop Time 0843    PT Time Calculation (min) 43 min    Equipment Utilized During Treatment Gait belt    Activity Tolerance Patient tolerated treatment well    Behavior During Therapy WFL for tasks assessed/performed                     Past Medical History:  Diagnosis Date   Asthma    Basal cell carcinoma of scalp and skin of face    Cataract    Neuromuscular disorder [facility])    Past Surgical History:  Procedure Laterality Date   AUGMENTATION MAMMAPLASTY Bilateral    Patient Active Problem List   Diagnosis Date Noted   Foot pain, bilateral 02/20/2021    ONSET DATE: Last MS exacerbation in Spring 2023  REFERRING DIAG: G35 (ICD-10-CM) - Multiple sclerosis  THERAPY DIAG:  Muscle weakness (generalized)  Unsteadiness on feet  Other abnormalities of gait and mobility  Rationale for Evaluation and Treatment Rehabilitation  SUBJECTIVE:                                                                                                                                                                                              SUBJECTIVE STATEMENT: The R knee is stiff and swollen today. Not sure what I did.  Pt accompanied by: self  PERTINENT HISTORY: MS; pt reports chronic R patellar dislocation   PAIN:  Are you having pain? Yes: NPRS scale: 2/10 Pain location: R knee Pain description: stiff Aggravating factors: N/A Relieving factors: stretching  PRECAUTIONS: Fall  PATIENT GOALS improve balance and R LE strength   OBJECTIVE:     TODAY'S  TREATMENT: 11/28/21 Activity Comments  Scifit L1 x 5 min    R heel slide with strap 10x Good tolerance   R quad set with 1/2 foam under heel  10x3" Good tolerance   R mod thomas stretch 2x30" With strap  R SLR with 2# 10x Cues to contract quad before each rep  R sidelying hip abduction 2# 2x5 Tendency to flex hip  R leg lifts from mod thomas position 1# 10x Cues to control eccentric phase  Romberg on foam EC 3x30" Mod-severe sway  Romberg on foam + head turns/nods  30" Mod-severe sway    PATIENT EDUCATION: Education details: HEP update and edu on airex pad for purchase Person educated: Patient Education method: Explanation, Demonstration, Tactile cues, Verbal cues, and Handouts Education comprehension: verbalized understanding and returned demonstration    HOME EXERCISE PROGRAM Last updated: 11/28/21 Access Code: [email or web address] URL: [email or web address] Date: 11/28/2021 Prepared by: [facility]  Program Notes perform at a counter top or corner for safety  Exercises - Straight Leg Raise with Ankle Weight  - 1 x daily - 5 x weekly - 2 sets - 10 reps - Sidelying Hip Abduction  - 1 x daily - 5 x weekly - 2 sets - 10 reps - Tandem Walking with Counter Support  - 1 x daily - 5 x weekly - 2 sets - 10 reps - Side Stepping with Resistance at Feet  - 1 x daily - 5 x weekly - 2 sets - 10 reps - Ankle Inversion with Resistance  - 1 x daily - 5 x weekly - 2 sets - 10 reps - Ankle Eversion with Resistance  - 1 x daily - 5 x weekly - 2 sets - 10 reps - Seated Arch Lifts  - 1 x daily - 5 x weekly - 2 sets - 10 reps - Standing Calf Raise With Small Ball at Heels  - 1 x daily - 5 x weekly - 2 sets - 10 reps - Supine Figure 4 Piriformis Stretch  - 1 x daily - 5 x weekly - 2 sets - 30 sec hold - Modified Thomas Stretch  - 1 x daily - 5 x weekly - 2 sets - 30 sec hold - Supine Butterfly Groin Stretch  - 1 x daily - 5 x weekly - 2 sets - 30 sec  hold - Marching with Resistance  - 1 x daily - 5 x weekly - 2 sets - 10 reps - Alternating Step Forward with Support  - 1 x daily - 5 x weekly - 2 sets - 10 reps - Lateral Step Down  - 1 x daily - 5 x weekly - 2 sets - 10 reps - Corner Balance Feet Apart: Eyes Closed With Head Turns  - 1 x daily - 5 x weekly - 2-3 sets - 30 se hold - Romberg Stance Eyes Closed on Foam Pad  - 1 x daily - 5 x weekly - 2 sets - 30 sec hold - Romberg Stance on Foam Pad with Head Rotation  - 1 x daily - 5 x weekly - 2 sets - 30 sec hold - Romberg Stance with Head Nods on Foam Pad  - 1 x daily - 5 x weekly - 2 sets - 30 sec hold     Below measures were taken at time of initial evaluation unless otherwise specified:  DIAGNOSTIC FINDINGS: 08/12/21 brain MRI: No specific evidence of progressive demyelinating disease. No acute abnormality or active demyelination identified.  COGNITION: Overall cognitive status: Within functional limits for tasks assessed   SENSATION: WFL  COORDINATION: B Alt pron/supination intact; finger to nose intact; heel to shin intact   MUSCLE TONE: LLE: Mild and in L quad   POSTURE: No Significant postural limitations  LOWER EXTREMITY ROM:     Active  Right Eval Left Eval  Hip flexion    Hip extension    Hip abduction    Hip adduction  Hip internal rotation    Hip external rotation    Knee flexion    Knee extension    Ankle dorsiflexion 16 14  Ankle plantarflexion    Ankle inversion    Ankle eversion     (Blank rows = not tested)  LOWER EXTREMITY MMT:    MMT Right Eval Left Eval  Hip flexion 4 5  Hip extension    Hip abduction 4 4  Hip adduction 4+ 4+  Hip internal rotation    Hip external rotation    Knee flexion 4 4+  Knee extension 4 4+  Ankle dorsiflexion 4+ 4+  Ankle plantarflexion 4 (completed 20 reps with heavy anterior trunk lean) 4 (completed 20 reps with heavy anterior trunk lean)  Ankle inversion    Ankle eversion    (Blank rows = not  tested)   GAIT: Gait pattern:  decreased L step length and R LE with reduced knee flexion through swing  Level of assistance: Complete Independence   PATIENT EDUCATION: Education details: exam findings, prognosis, POC, HEP Person educated: Patient Education method: Explanation, Demonstration, Tactile cues, Verbal cues, and Handouts Education comprehension: verbalized understanding and returned demonstration   HOME EXERCISE PROGRAM: Access Code: [email or web address] URL: [email or web address] Date: 10/24/2021 Prepared by: [facility]  Program Notes perform at a counter top or corner for safety  Exercises - Standing Terminal Knee Extension with Resistance  - 1 x daily - 5 x weekly - 2 sets - 10 reps - Sit to Stand with Arms Crossed  - 1 x daily - 5 x weekly - 2 sets - 10 reps - Sidelying Hip Abduction  - 1 x daily - 5 x weekly - 2 sets - 10 reps - Tandem Walking with Counter Support  - 1 x daily - 5 x weekly - 2 sets - 10 reps - Backward Walking with Counter Support  - 1 x daily - 5 x weekly - 2 sets - 10 reps - Romberg Stance with Eyes Closed  - 1 x daily - 5 x weekly - 2 sets - 30 sec hold   GOALS: Goals reviewed with patient? Yes  SHORT TERM GOALS: Target date: 11/14/2021  Patient to be independent with initial HEP. Baseline: HEP initiated Goal status: MET    LONG TERM GOALS: Target date: 12/05/2021  Patient to be independent with advanced HEP. Baseline: Not yet initiated  Goal status: IN PROGRESS  Patient to demonstrate alternating reciprocal pattern when ascending and descending stairs with good stability and 1 handrail as needed.   Baseline: step-to pattern Goal status: IN PROGRESS  Patient to score at least 24/30 on FGA in order to decrease risk of falls.  Baseline: 20/30 Goal status: IN PROGRESS  Patient to demonstrate gait speed of 3.44 ft/sec in order to meet MCID. Baseline: 2.62 ft/sec Goal status:  INITIAL  Patient to demonstrate 5xSTS test in <15 sec and with good eccentric control in order to demonstrate improved functional strength. Baseline: poor eccentric control  Goal status: IN PROGRESS   ASSESSMENT:  CLINICAL IMPRESSION:  Patient arrived to session with report of increased R knee stiffness and edema this AM without known cause. Worked on gentle stretching activities to address patient's complaints. Hip and quad strengthening activities were tolerated well; required intermittent cues for form. Balance on compliant surface revealed difficulty utilizing vestibular system for stability, thus updated these exercises into HEP to be performed in corner for safety. Patient reported understanding of all edu  provided and without complaints at end of session.      OBJECTIVE IMPAIRMENTS Abnormal gait, decreased activity tolerance, decreased balance, difficulty walking, decreased strength, increased muscle spasms, and impaired tone.   ACTIVITY LIMITATIONS carrying, lifting, squatting, stairs, and transfers  PARTICIPATION LIMITATIONS: meal prep, cleaning, laundry, shopping, community activity, yard work, and church  PERSONAL FACTORS Age, Fitness, Past/current experiences, Time since onset of injury/illness/exacerbation, and 3+ comorbidities: asthma, MS, R patellar dislocation  are also affecting patient's functional outcome.   REHAB POTENTIAL: Good  CLINICAL DECISION MAKING: Evolving/moderate complexity  EVALUATION COMPLEXITY: Moderate  PLAN: PT FREQUENCY: 2x/week  PT DURATION: 6 weeks  PLANNED INTERVENTIONS: Therapeutic exercises, Therapeutic activity, Neuromuscular re-education, Balance training, Gait training, Patient/Family education, Self Care, Joint mobilization, Stair training, Vestibular training, Canalith repositioning, Aquatic Therapy, Dry Needling, Electrical stimulation, Cryotherapy, Moist heat, Taping, Manual therapy, and Re-evaluation  PLAN FOR NEXT SESSION: keep  working on hip flexion and knee flexion strength; progress R quad and hip strength, high level balance activities;    Janene Harvey, PT, DPT 11/28/21 8:46 AM  [facility] [address] [place], [place], [place] [address] Phone # [phone] Fax # [phone]

## 2021-11-28 ENCOUNTER — Ambulatory Visit: Payer: BC Managed Care – PPO | Admitting: Physical Therapy

## 2021-11-28 ENCOUNTER — Encounter: Payer: Self-pay | Admitting: Physical Therapy

## 2021-11-28 DIAGNOSIS — M6281 Muscle weakness (generalized): Secondary | ICD-10-CM | POA: Diagnosis not present

## 2021-11-28 DIAGNOSIS — R2681 Unsteadiness on feet: Secondary | ICD-10-CM

## 2021-11-28 DIAGNOSIS — R2689 Other abnormalities of gait and mobility: Secondary | ICD-10-CM

## 2021-12-02 ENCOUNTER — Ambulatory Visit: Payer: BC Managed Care – PPO | Admitting: Physical Therapy

## 2021-12-02 ENCOUNTER — Encounter: Payer: Self-pay | Admitting: Physical Therapy

## 2021-12-02 DIAGNOSIS — R2681 Unsteadiness on feet: Secondary | ICD-10-CM | POA: Diagnosis not present

## 2021-12-02 DIAGNOSIS — R2689 Other abnormalities of gait and mobility: Secondary | ICD-10-CM

## 2021-12-02 DIAGNOSIS — M6281 Muscle weakness (generalized): Secondary | ICD-10-CM | POA: Diagnosis not present

## 2021-12-02 NOTE — Therapy (Signed)
OUTPATIENT PHYSICAL THERAPY NEURO PROGRESS NOTE   Patient Name: Tara Ross MRN: [id number] DOB:1961/02/28, 61 y.o., female Today's Date: 12/02/2021   PCP: [facility]  REFERRING PROVIDER: Ala Bent, MD   PT End of Session - 12/02/21 1659     Visit Number 10    Number of Visits 18    Date for PT Re-Evaluation 12/30/21    Authorization Type BCBS    PT Start Time 1615    PT Stop Time 1657    PT Time Calculation (min) 42 min    Equipment Utilized During Treatment Gait belt    Activity Tolerance Patient tolerated treatment well    Behavior During Therapy WFL for tasks assessed/performed                      Past Medical History:  Diagnosis Date   Asthma    Basal cell carcinoma of scalp and skin of face    Cataract    Neuromuscular disorder [facility])    Past Surgical History:  Procedure Laterality Date   AUGMENTATION MAMMAPLASTY Bilateral    Patient Active Problem List   Diagnosis Date Noted   Foot pain, bilateral 02/20/2021    ONSET DATE: Last MS exacerbation in Spring 2023  REFERRING DIAG: G35 (ICD-10-CM) - Multiple sclerosis  THERAPY DIAG:  Muscle weakness (generalized)  Unsteadiness on feet  Other abnormalities of gait and mobility  Rationale for Evaluation and Treatment Rehabilitation  SUBJECTIVE:                                                                                                                                                                                              SUBJECTIVE STATEMENT: The R knee is feeling a little less stiff.  Pt accompanied by: self  PERTINENT HISTORY: MS; pt reports chronic R patellar dislocation   PAIN:  Are you having pain? No  PRECAUTIONS: Fall  PATIENT GOALS improve balance and R LE strength   OBJECTIVE:     TODAY'S TREATMENT: 12/02/21   [facility] PT Assessment - 12/02/21 0001       Standardized Balance Assessment   Five times sit to stand comments   8.79 sec   with limited eccentric control and without UEs; 9.73 sec when prompting to improve eccentric control   10 Meter Walk 13.07 sec   without AD     Functional Gait  Assessment   Gait Level Surface Walks 20 ft in less than 7 sec but greater than 5.5 sec, uses assistive device, slower speed, mild gait deviations, or deviates 6-10 in outside of the 12 in  walkway width.    Change in Gait Speed Able to change speed, demonstrates mild gait deviations, deviates 6-10 in outside of the 12 in walkway width, or no gait deviations, unable to achieve a major change in velocity, or uses a change in velocity, or uses an assistive device.    Gait with Horizontal Head Turns Performs head turns smoothly with slight change in gait velocity (eg, minor disruption to smooth gait path), deviates 6-10 in outside 12 in walkway width, or uses an assistive device.    Gait with Vertical Head Turns Performs task with slight change in gait velocity (eg, minor disruption to smooth gait path), deviates 6 - 10 in outside 12 in walkway width or uses assistive device    Gait and Pivot Turn Pivot turns safely within 3 sec and stops quickly with no loss of balance.    Step Over Obstacle Is able to step over 2 stacked shoe boxes taped together (9 in total height) without changing gait speed. No evidence of imbalance.    Gait with Narrow Base of Support Ambulates 4-7 steps.    Gait with Eyes Closed Walks 20 ft, uses assistive device, slower speed, mild gait deviations, deviates 6-10 in outside 12 in walkway width. Ambulates 20 ft in less than 9 sec but greater than 7 sec.    Ambulating Backwards Walks 20 ft, no assistive devices, good speed, no evidence for imbalance, normal gait    Steps Alternating feet, no rail.    Total Score 23             PATIENT EDUCATION: Education details: detailed review of HEP and reorganization/grouping of exercises to be performed in 3 groups, alternating every day in order to save time; edu on  exam findings and progress towards goals  Person educated: Patient Education method: Explanation, Demonstration, Tactile cues, Verbal cues, and Handouts Education comprehension: verbalized understanding    HOME EXERCISE PROGRAM Last updated: 11/28/21 Access Code: [email or web address] URL: [email or web address] Date: 11/28/2021 Prepared by: [facility]  Program Notes perform at a counter top or corner for safety  Exercises - Straight Leg Raise with Ankle Weight  - 1 x daily - 5 x weekly - 2 sets - 10 reps - Sidelying Hip Abduction  - 1 x daily - 5 x weekly - 2 sets - 10 reps - Tandem Walking with Counter Support  - 1 x daily - 5 x weekly - 2 sets - 10 reps - Side Stepping with Resistance at Feet  - 1 x daily - 5 x weekly - 2 sets - 10 reps - Ankle Inversion with Resistance  - 1 x daily - 5 x weekly - 2 sets - 10 reps - Ankle Eversion with Resistance  - 1 x daily - 5 x weekly - 2 sets - 10 reps - Seated Arch Lifts  - 1 x daily - 5 x weekly - 2 sets - 10 reps - Standing Calf Raise With Small Ball at Heels  - 1 x daily - 5 x weekly - 2 sets - 10 reps - Supine Figure 4 Piriformis Stretch  - 1 x daily - 5 x weekly - 2 sets - 30 sec hold - Modified Thomas Stretch  - 1 x daily - 5 x weekly - 2 sets - 30 sec hold - Supine Butterfly Groin Stretch  - 1 x daily - 5 x weekly - 2 sets - 30 sec hold - Marching with Resistance  -  1 x daily - 5 x weekly - 2 sets - 10 reps - Alternating Step Forward with Support  - 1 x daily - 5 x weekly - 2 sets - 10 reps - Lateral Step Down  - 1 x daily - 5 x weekly - 2 sets - 10 reps - Corner Balance Feet Apart: Eyes Closed With Head Turns  - 1 x daily - 5 x weekly - 2-3 sets - 30 se hold - Romberg Stance Eyes Closed on Foam Pad  - 1 x daily - 5 x weekly - 2 sets - 30 sec hold - Romberg Stance on Foam Pad with Head Rotation  - 1 x daily - 5 x weekly - 2 sets - 30 sec hold - Romberg Stance with Head Nods on Foam Pad  - 1 x  daily - 5 x weekly - 2 sets - 30 sec hold     Below measures were taken at time of initial evaluation unless otherwise specified:  DIAGNOSTIC FINDINGS: 08/12/21 brain MRI: No specific evidence of progressive demyelinating disease. No acute abnormality or active demyelination identified.  COGNITION: Overall cognitive status: Within functional limits for tasks assessed   SENSATION: WFL  COORDINATION: B Alt pron/supination intact; finger to nose intact; heel to shin intact   MUSCLE TONE: LLE: Mild and in L quad   POSTURE: No Significant postural limitations  LOWER EXTREMITY ROM:     Active  Right Eval Left Eval  Hip flexion    Hip extension    Hip abduction    Hip adduction    Hip internal rotation    Hip external rotation    Knee flexion    Knee extension    Ankle dorsiflexion 16 14  Ankle plantarflexion    Ankle inversion    Ankle eversion     (Blank rows = not tested)  LOWER EXTREMITY MMT:    MMT Right Eval Left Eval  Hip flexion 4 5  Hip extension    Hip abduction 4 4  Hip adduction 4+ 4+  Hip internal rotation    Hip external rotation    Knee flexion 4 4+  Knee extension 4 4+  Ankle dorsiflexion 4+ 4+  Ankle plantarflexion 4 (completed 20 reps with heavy anterior trunk lean) 4 (completed 20 reps with heavy anterior trunk lean)  Ankle inversion    Ankle eversion    (Blank rows = not tested)   GAIT: Gait pattern:  decreased L step length and R LE with reduced knee flexion through swing  Level of assistance: Complete Independence   PATIENT EDUCATION: Education details: exam findings, prognosis, POC, HEP Person educated: Patient Education method: Explanation, Demonstration, Tactile cues, Verbal cues, and Handouts Education comprehension: verbalized understanding and returned demonstration   HOME EXERCISE PROGRAM: Access Code: [email or web address] URL: [email or web address] Date: 10/24/2021 Prepared by: [facility]  Program Notes perform at a counter top or corner for safety  Exercises - Standing Terminal Knee Extension with Resistance  - 1 x daily - 5 x weekly - 2 sets - 10 reps - Sit to Stand with Arms Crossed  - 1 x daily - 5 x weekly - 2 sets - 10 reps - Sidelying Hip Abduction  - 1 x daily - 5 x weekly - 2 sets - 10 reps - Tandem Walking with Counter Support  - 1 x daily - 5 x weekly - 2 sets - 10 reps - Backward Walking with [employer] Support  -  1 x daily - 5 x weekly - 2 sets - 10 reps - Romberg Stance with Eyes Closed  - 1 x daily - 5 x weekly - 2 sets - 30 sec hold   GOALS: Goals reviewed with patient? Yes  SHORT TERM GOALS: Target date: 11/14/2021  Patient to be independent with initial HEP. Baseline: HEP initiated Goal status: MET    LONG TERM GOALS: Target date: 12/30/2021  Patient to be independent with advanced HEP. Baseline: Not yet initiated  Goal status: IN PROGRESS  Patient to demonstrate alternating reciprocal pattern when ascending and descending stairs with good stability and 1 handrail as needed.   Baseline: step-to pattern; 12/02/21 able to perform step through without handrail  Goal status: MET  Patient to score at least 24/30 on FGA in order to decrease risk of falls.  Baseline: 20/30; 23/30 12/02/21 Goal status: IN PROGRESS  Patient to demonstrate gait speed of 3.44 ft/sec in order to meet MCID. Baseline: 2.62 ft/sec; 2.51 ft/sec 12/02/21 Goal status: IN PROGRESS   Patient to demonstrate 5xSTS test in <15 sec and with good eccentric control in order to demonstrate improved functional strength. Baseline: poor eccentric control; 9 sec with limited eccentric control 12/02/21 Goal status: IN PROGRESS   ASSESSMENT:  CLINICAL IMPRESSION: Patient arrived to session without new complaints today; notes some improvement in R knee stiffness. Able to perform alternating reciprocal stair navigation with and without handrail with good stability; still  some apprehension evident. Patient scored 23/30 on FGA, indicating a decreased risk of falls. Most challenge evident with narrow BOS, gait with EC, and gait with head turns/nods. Gait speed today measured 2.70f/sec; grossly unchanged. Able to perform 5xSTS in 9 sec however still with some limited eccentric control. Patient is making good progress towards goals; would benefit from additional skilled PT services 2x/week for 4 weeks to address remaining goals.     OBJECTIVE IMPAIRMENTS Abnormal gait, decreased activity tolerance, decreased balance, difficulty walking, decreased strength, increased muscle spasms, and impaired tone.   ACTIVITY LIMITATIONS carrying, lifting, squatting, stairs, and transfers  PARTICIPATION LIMITATIONS: meal prep, cleaning, laundry, shopping, community activity, yard work, and church  PERSONAL FACTORS Age, Fitness, Past/current experiences, Time since onset of injury/illness/exacerbation, and 3+ comorbidities: asthma, MS, R patellar dislocation  are also affecting patient's functional outcome.   REHAB POTENTIAL: Good  CLINICAL DECISION MAKING: Evolving/moderate complexity  EVALUATION COMPLEXITY: Moderate  PLAN: PT FREQUENCY: 2x/week  PT DURATION: 4 weeks  PLANNED INTERVENTIONS: Therapeutic exercises, Therapeutic activity, Neuromuscular re-education, Balance training, Gait training, Patient/Family education, Self Care, Joint mobilization, Stair training, Vestibular training, Canalith repositioning, Aquatic Therapy, Dry Needling, Electrical stimulation, Cryotherapy, Moist heat, Taping, Manual therapy, and Re-evaluation  PLAN FOR NEXT SESSION: keep working on hip flexion and knee flexion strength; progress R quad and hip strength, high level balance activities;    YJanene Harvey PT, DPT 12/02/21 5:00 PM  [facility] [place] [address]Phone # [phone] # [phone]

## 2021-12-04 NOTE — Therapy (Signed)
OUTPATIENT PHYSICAL THERAPY NEURO NOTE   Patient Name: Tara Ross MRN: [id number] DOB:October 10, 1960, 61 y.o., female Today's Date: 12/05/2021   [facility]P: [facility]  REFERRING PROVIDER: Ala Bent, MD   PT End of Session - 12/05/21 0844     Visit Number 11    Number of Visits 18    Date for PT Re-Evaluation 12/30/21    Authorization Type BCBS    Progress Note Due on Visit 4    PT Start Time 0802    PT Stop Time 0843    PT Time Calculation (min) 41 min    Equipment Utilized During Treatment Gait belt    Activity Tolerance Patient tolerated treatment well    Behavior During Therapy WFL for tasks assessed/performed                       Past Medical History:  Diagnosis Date   Asthma    Basal cell carcinoma of scalp and skin of face    Cataract    Neuromuscular disorder [facility])    Past Surgical History:  Procedure Laterality Date   AUGMENTATION MAMMAPLASTY Bilateral    Patient Active Problem List   Diagnosis Date Noted   Foot pain, bilateral 02/20/2021    ONSET DATE: Last MS exacerbation in Spring 2023  REFERRING DIAG: G35 (ICD-10-CM) - Multiple sclerosis  THERAPY DIAG:  Muscle weakness (generalized)  Unsteadiness on feet  Other abnormalities of gait and mobility  Rationale for Evaluation and Treatment Rehabilitation  SUBJECTIVE:                                                                                                                                                                                              SUBJECTIVE STATEMENT: The knee is feeling just average. Reports some dizziness with head turn balance activities.  Pt accompanied by: self  PERTINENT HISTORY: MS; pt reports chronic R patellar dislocation   PAIN:  Are you having pain? No  PRECAUTIONS: Fall  PATIENT GOALS improve balance and R LE strength   OBJECTIVE:     TODAY'S TREATMENT: 12/05/21 Activity Comments  Scifit L3.5 x 6 min     Standing with EC head nods/turns  Cues to reduce speed and allow longer rest breaks in between; good stability but c/o some dizziness  Standing on foam with EO head nods/turns  30" each 3-4/10 dizziness; cues to reduce speed to decrease instability   10# farmer's carry 1 min each hand, then 1 min in both hands Most stable with # in B hands; episodes of scissoring and imbalance with CGA-min A with #  in 1 hand  10# at chest sidestepping with weight at chest, then # in each hand Good stability   step ups with medball at chest Using 6" on L LE, 2" on R LE      HOME EXERCISE PROGRAM Last updated: 11/28/21 Access Code: [email or web address] URL: [email or web address] Date: 11/28/2021 Prepared by: [facility]  Program Notes perform at a counter top or corner for safety  Exercises - Straight Leg Raise with Ankle Weight  - 1 x daily - 5 x weekly - 2 sets - 10 reps - Sidelying Hip Abduction  - 1 x daily - 5 x weekly - 2 sets - 10 reps - Tandem Walking with Counter Support  - 1 x daily - 5 x weekly - 2 sets - 10 reps - Side Stepping with Resistance at Feet  - 1 x daily - 5 x weekly - 2 sets - 10 reps - Ankle Inversion with Resistance  - 1 x daily - 5 x weekly - 2 sets - 10 reps - Ankle Eversion with Resistance  - 1 x daily - 5 x weekly - 2 sets - 10 reps - Seated Arch Lifts  - 1 x daily - 5 x weekly - 2 sets - 10 reps - Standing Calf Raise With Small Ball at Heels  - 1 x daily - 5 x weekly - 2 sets - 10 reps - Supine Figure 4 Piriformis Stretch  - 1 x daily - 5 x weekly - 2 sets - 30 sec hold - Modified Thomas Stretch  - 1 x daily - 5 x weekly - 2 sets - 30 sec hold - Supine Butterfly Groin Stretch  - 1 x daily - 5 x weekly - 2 sets - 30 sec hold - Marching with Resistance  - 1 x daily - 5 x weekly - 2 sets - 10 reps - Alternating Step Forward with Support  - 1 x daily - 5 x weekly - 2 sets - 10 reps - Lateral Step Down  - 1 x daily - 5 x weekly - 2 sets -  10 reps - Corner Balance Feet Apart: Eyes Closed With Head Turns  - 1 x daily - 5 x weekly - 2-3 sets - 30 se hold - Romberg Stance Eyes Closed on Foam Pad  - 1 x daily - 5 x weekly - 2 sets - 30 sec hold - Romberg Stance on Foam Pad with Head Rotation  - 1 x daily - 5 x weekly - 2 sets - 30 sec hold - Romberg Stance with Head Nods on Foam Pad  - 1 x daily - 5 x weekly - 2 sets - 30 sec hold     Below measures were taken at time of initial evaluation unless otherwise specified:  DIAGNOSTIC FINDINGS: 08/12/21 brain MRI: No specific evidence of progressive demyelinating disease. No acute abnormality or active demyelination identified.  COGNITION: Overall cognitive status: Within functional limits for tasks assessed   SENSATION: WFL  COORDINATION: B Alt pron/supination intact; finger to nose intact; heel to shin intact   MUSCLE TONE: LLE: Mild and in L quad   POSTURE: No Significant postural limitations  LOWER EXTREMITY ROM:     Active  Right Eval Left Eval  Hip flexion    Hip extension    Hip abduction    Hip adduction    Hip internal rotation    Hip external rotation    Knee flexion  Knee extension    Ankle dorsiflexion 16 14  Ankle plantarflexion    Ankle inversion    Ankle eversion     (Blank rows = not tested)  LOWER EXTREMITY MMT:    MMT Right Eval Left Eval  Hip flexion 4 5  Hip extension    Hip abduction 4 4  Hip adduction 4+ 4+  Hip internal rotation    Hip external rotation    Knee flexion 4 4+  Knee extension 4 4+  Ankle dorsiflexion 4+ 4+  Ankle plantarflexion 4 (completed 20 reps with heavy anterior trunk lean) 4 (completed 20 reps with heavy anterior trunk lean)  Ankle inversion    Ankle eversion    (Blank rows = not tested)   GAIT: Gait pattern:  decreased L step length and R LE with reduced knee flexion through swing  Level of assistance: Complete Independence   PATIENT EDUCATION: Education details: exam findings, prognosis, POC,  HEP Person educated: Patient Education method: Explanation, Demonstration, Tactile cues, Verbal cues, and Handouts Education comprehension: verbalized understanding and returned demonstration   HOME EXERCISE PROGRAM: Access Code: [email or web address] URL: [email or web address] Date: 10/24/2021 Prepared by: [facility]  Program Notes perform at a counter top or corner for safety  Exercises - Standing Terminal Knee Extension with Resistance  - 1 x daily - 5 x weekly - 2 sets - 10 reps - Sit to Stand with Arms Crossed  - 1 x daily - 5 x weekly - 2 sets - 10 reps - Sidelying Hip Abduction  - 1 x daily - 5 x weekly - 2 sets - 10 reps - Tandem Walking with Counter Support  - 1 x daily - 5 x weekly - 2 sets - 10 reps - Backward Walking with Counter Support  - 1 x daily - 5 x weekly - 2 sets - 10 reps - Romberg Stance with Eyes Closed  - 1 x daily - 5 x weekly - 2 sets - 30 sec hold   GOALS: Goals reviewed with patient? Yes  SHORT TERM GOALS: Target date: 11/14/2021  Patient to be independent with initial HEP. Baseline: HEP initiated Goal status: MET    LONG TERM GOALS: Target date: 12/30/2021  Patient to be independent with advanced HEP. Baseline: Not yet initiated  Goal status: IN PROGRESS  Patient to demonstrate alternating reciprocal pattern when ascending and descending stairs with good stability and 1 handrail as needed.   Baseline: step-to pattern; 12/02/21 able to perform step through without handrail  Goal status: MET  Patient to score at least 24/30 on FGA in order to decrease risk of falls.  Baseline: 20/30; 23/30 12/02/21 Goal status: IN PROGRESS  Patient to demonstrate gait speed of 3.44 ft/sec in order to meet MCID. Baseline: 2.62 ft/sec; 2.51 ft/sec 12/02/21 Goal status: IN PROGRESS   Patient to demonstrate 5xSTS test in <15 sec and with good eccentric control in order to demonstrate improved functional  strength. Baseline: poor eccentric control; 9 sec with limited eccentric control 12/02/21 Goal status: IN PROGRESS   ASSESSMENT:  CLINICAL IMPRESSION: Patient arrived to session with report of some dizziness with head turn balance activities. Reviewed these activities with cueing to reduce speed and increase duration of rest breaks in between exercises. D/t c/o dizziness today, plan for vestibular assessment next session. Worked on carrying items with dynamic balance challenges to address patient's report of difficulty with lifting. Most imbalance was evident with ambulation with weight rather than  step ups or sidestepping. Patient tolerated session well and without complaints upon leaving.    OBJECTIVE IMPAIRMENTS Abnormal gait, decreased activity tolerance, decreased balance, difficulty walking, decreased strength, increased muscle spasms, and impaired tone.   ACTIVITY LIMITATIONS carrying, lifting, squatting, stairs, and transfers  PARTICIPATION LIMITATIONS: meal prep, cleaning, laundry, shopping, community activity, yard work, and church  PERSONAL FACTORS Age, Fitness, Past/current experiences, Time since onset of injury/illness/exacerbation, and 3+ comorbidities: asthma, MS, R patellar dislocation  are also affecting patient's functional outcome.   REHAB POTENTIAL: Good  CLINICAL DECISION MAKING: Evolving/moderate complexity  EVALUATION COMPLEXITY: Moderate  PLAN: PT FREQUENCY: 2x/week  PT DURATION: 4 weeks  PLANNED INTERVENTIONS: Therapeutic exercises, Therapeutic activity, Neuromuscular re-education, Balance training, Gait training, Patient/Family education, Self Care, Joint mobilization, Stair training, Vestibular training, Canalith repositioning, Aquatic Therapy, Dry Needling, Electrical stimulation, Cryotherapy, Moist heat, Taping, Manual therapy, and Re-evaluation  PLAN FOR NEXT SESSION: keep working on hip flexion and knee flexion strength; progress R quad and hip  strength, high level balance activities;    Janene Harvey, PT, DPT 12/05/21 8:45 AM  [facility] [address] [place], [place], [place] [address] Phone # [phone] Fax # [phone]

## 2021-12-05 ENCOUNTER — Encounter: Payer: Self-pay | Admitting: Physical Therapy

## 2021-12-05 ENCOUNTER — Ambulatory Visit: Payer: BC Managed Care – PPO | Admitting: Physical Therapy

## 2021-12-05 DIAGNOSIS — R2681 Unsteadiness on feet: Secondary | ICD-10-CM

## 2021-12-05 DIAGNOSIS — M6281 Muscle weakness (generalized): Secondary | ICD-10-CM | POA: Diagnosis not present

## 2021-12-05 DIAGNOSIS — R2689 Other abnormalities of gait and mobility: Secondary | ICD-10-CM | POA: Diagnosis not present

## 2021-12-08 ENCOUNTER — Ambulatory Visit: Payer: BC Managed Care – PPO | Admitting: Physical Therapy

## 2021-12-09 ENCOUNTER — Ambulatory Visit: Payer: BC Managed Care – PPO | Admitting: Physical Therapy

## 2021-12-11 NOTE — Therapy (Signed)
OUTPATIENT PHYSICAL THERAPY NEURO NOTE   Patient Name: Tara Ross MRN: [id number] DOB:1961-01-03, 61 y.o., female Today's Date: 12/12/2021   PCP: [facility]  REFERRING PROVIDER: Ala Bent, MD   PT End of Session - 12/12/21 0934     Visit Number 12    Number of Visits 18    Date for PT Re-Evaluation 12/30/21    Authorization Type BCBS    Progress Note Due on Visit 20    PT Start Time 0848    PT Stop Time 0932    PT Time Calculation (min) 44 min    Activity Tolerance Patient tolerated treatment well    Behavior During Therapy WFL for tasks assessed/performed                        Past Medical History:  Diagnosis Date   Asthma    Basal cell carcinoma of scalp and skin of face    Cataract    Neuromuscular disorder [facility])    Past Surgical History:  Procedure Laterality Date   AUGMENTATION MAMMAPLASTY Bilateral    Patient Active Problem List   Diagnosis Date Noted   Foot pain, bilateral 02/20/2021    ONSET DATE: Last MS exacerbation in Spring 2023  REFERRING DIAG: G35 (ICD-10-CM) - Multiple sclerosis  THERAPY DIAG:  Muscle weakness (generalized)  Unsteadiness on feet  Other abnormalities of gait and mobility  Rationale for Evaluation and Treatment Rehabilitation  SUBJECTIVE:                                                                                                                                                                                              SUBJECTIVE STATEMENT: Has been busy this week with work. Denies hx of dizziness but does have some dizziness when doing some of her exercises with head movement. Recalls that when walking and quickly turnng her head she will get a funny feeling. Denies vision changes/double vision, hearing loss, tinnitus, photo/phonophobia. Reports that she had migraines until middle school.    Pt accompanied by: self  PERTINENT HISTORY: MS; pt reports chronic R  patellar dislocation   PAIN:  Are you having pain? No  PRECAUTIONS: Fall  PATIENT GOALS improve balance and R LE strength   OBJECTIVE:    TODAY'S TREATMENT: 12/12/21  VESTIBULAR ASSESSMENT   GENERAL OBSERVATION: patient does not wear contacts or glasses     OCULOMOTOR EXAM:   Ocular Alignment:  slight L eye ptosis   Ocular ROM: No Limitations   Spontaneous Nystagmus: absent   Gaze-Induced Nystagmus: absent   Smooth Pursuits: intact  Saccades: intact   Convergence/Divergence: 2 cm     VESTIBULAR - OCULAR REFLEX:    Slow VOR: Normal; c/o mild wooziness with horizontal and vertical   VOR Cancellation: Normal   Head-Impulse Test: HIT Right: negative HIT Left: slight positive  *upon retest, R side appeared slightly positive and L was negative    R brandt daroff at quick pace: 1/10 dizziness upon sitting up  L brandt daroff at quick pace: 1/10 dizziness upon sitting up   EO R brandt daroff at quick pace: 2/10 wooziness and mild imbalance  EO L brandt daroff at quick pace: 1.5/10 wooziness and mild imbalance     Activity Comments  Sidestepping on foam beam  Good step length; mild unsteadiness  Standing on foam beam + multidirectional perturbations  Occasional reaching strategy required     PATIENT EDUCATION: Education details: discussion on exam findings, recommending intensity/pacing with vestibular exercises, edu on VOR Person educated: Patient Education method: Explanation, Demonstration, Tactile cues, and Verbal cues Education comprehension: verbalized understanding and returned demonstration   HOME EXERCISE PROGRAM Last updated: 11/28/21 Access Code: [email or web address] URL: [email or web address] Date: 11/28/2021 Prepared by: [facility]  Program Notes perform at a counter top or corner for safety  Exercises - Straight Leg Raise with Ankle Weight  - 1 x daily - 5 x weekly - 2 sets - 10 reps - Sidelying Hip  Abduction  - 1 x daily - 5 x weekly - 2 sets - 10 reps - Tandem Walking with Counter Support  - 1 x daily - 5 x weekly - 2 sets - 10 reps - Side Stepping with Resistance at Feet  - 1 x daily - 5 x weekly - 2 sets - 10 reps - Ankle Inversion with Resistance  - 1 x daily - 5 x weekly - 2 sets - 10 reps - Ankle Eversion with Resistance  - 1 x daily - 5 x weekly - 2 sets - 10 reps - Seated Arch Lifts  - 1 x daily - 5 x weekly - 2 sets - 10 reps - Standing Calf Raise With Small Ball at Heels  - 1 x daily - 5 x weekly - 2 sets - 10 reps - Supine Figure 4 Piriformis Stretch  - 1 x daily - 5 x weekly - 2 sets - 30 sec hold - Modified Thomas Stretch  - 1 x daily - 5 x weekly - 2 sets - 30 sec hold - Supine Butterfly Groin Stretch  - 1 x daily - 5 x weekly - 2 sets - 30 sec hold - Marching with Resistance  - 1 x daily - 5 x weekly - 2 sets - 10 reps - Alternating Step Forward with Support  - 1 x daily - 5 x weekly - 2 sets - 10 reps - Lateral Step Down  - 1 x daily - 5 x weekly - 2 sets - 10 reps - Corner Balance Feet Apart: Eyes Closed With Head Turns  - 1 x daily - 5 x weekly - 2-3 sets - 30 se hold - Romberg Stance Eyes Closed on Foam Pad  - 1 x daily - 5 x weekly - 2 sets - 30 sec hold - Romberg Stance on Foam Pad with Head Rotation  - 1 x daily - 5 x weekly - 2 sets - 30 sec hold - Romberg Stance with Head Nods on Foam Pad  - 1 x daily - 5 x  weekly - 2 sets - 30 sec hold     Below measures were taken at time of initial evaluation unless otherwise specified:  DIAGNOSTIC FINDINGS: 08/12/21 brain MRI: No specific evidence of progressive demyelinating disease. No acute abnormality or active demyelination identified.  COGNITION: Overall cognitive status: Within functional limits for tasks assessed   SENSATION: WFL  COORDINATION: B Alt pron/supination intact; finger to nose intact; heel to shin intact   MUSCLE TONE: LLE: Mild and in L quad   POSTURE: No Significant postural  limitations  LOWER EXTREMITY ROM:     Active  Right Eval Left Eval  Hip flexion    Hip extension    Hip abduction    Hip adduction    Hip internal rotation    Hip external rotation    Knee flexion    Knee extension    Ankle dorsiflexion 16 14  Ankle plantarflexion    Ankle inversion    Ankle eversion     (Blank rows = not tested)  LOWER EXTREMITY MMT:    MMT Right Eval Left Eval  Hip flexion 4 5  Hip extension    Hip abduction 4 4  Hip adduction 4+ 4+  Hip internal rotation    Hip external rotation    Knee flexion 4 4+  Knee extension 4 4+  Ankle dorsiflexion 4+ 4+  Ankle plantarflexion 4 (completed 20 reps with heavy anterior trunk lean) 4 (completed 20 reps with heavy anterior trunk lean)  Ankle inversion    Ankle eversion    (Blank rows = not tested)   GAIT: Gait pattern:  decreased L step length and R LE with reduced knee flexion through swing  Level of assistance: Complete Independence   PATIENT EDUCATION: Education details: exam findings, prognosis, POC, HEP Person educated: Patient Education method: Explanation, Demonstration, Tactile cues, Verbal cues, and Handouts Education comprehension: verbalized understanding and returned demonstration   HOME EXERCISE PROGRAM: Access Code: [email or web address] URL: [email or web address] Date: 10/24/2021 Prepared by: [facility]  Program Notes perform at a counter top or corner for safety  Exercises - Standing Terminal Knee Extension with Resistance  - 1 x daily - 5 x weekly - 2 sets - 10 reps - Sit to Stand with Arms Crossed  - 1 x daily - 5 x weekly - 2 sets - 10 reps - Sidelying Hip Abduction  - 1 x daily - 5 x weekly - 2 sets - 10 reps - Tandem Walking with Counter Support  - 1 x daily - 5 x weekly - 2 sets - 10 reps - Backward Walking with Counter Support  - 1 x daily - 5 x weekly - 2 sets - 10 reps - Romberg Stance with Eyes Closed  - 1 x daily - 5 x weekly  - 2 sets - 30 sec hold   GOALS: Goals reviewed with patient? Yes  SHORT TERM GOALS: Target date: 11/14/2021  Patient to be independent with initial HEP. Baseline: HEP initiated Goal status: MET    LONG TERM GOALS: Target date: 12/30/2021  Patient to be independent with advanced HEP. Baseline: Not yet initiated  Goal status: IN PROGRESS  Patient to demonstrate alternating reciprocal pattern when ascending and descending stairs with good stability and 1 handrail as needed.   Baseline: step-to pattern; 12/02/21 able to perform step through without handrail  Goal status: MET  Patient to score at least 24/30 on FGA in order to decrease risk of falls.  Baseline: 20/30; 23/30 12/02/21 Goal status: IN PROGRESS  Patient to demonstrate gait speed of 3.44 ft/sec in order to meet MCID. Baseline: 2.62 ft/sec; 2.51 ft/sec 12/02/21 Goal status: IN PROGRESS   Patient to demonstrate 5xSTS test in <15 sec and with good eccentric control in order to demonstrate improved functional strength. Baseline: poor eccentric control; 9 sec with limited eccentric control 12/02/21 Goal status: IN PROGRESS   ASSESSMENT:  CLINICAL IMPRESSION: Patient arrived to session without new complaints. Vestibular assessment performed d/t patient's c/o some dizziness with head turns. Denies hx of vertigo, vision changes/double vision, hearing loss, tinnitus, photo/phonophobia. Reports that she had migraines until middle school. Patient today presented with slight L eye ptosis, dizziness with vertical and horizontal VOR, and motion sensitivity with quick movements, worse with EC. HIT was inconclusive - may need to re-test next session. Reviewed HEP with patient which did not require adjustment as deficits are already being addressed with current HEP. Patient reported understanding of all edu provided and without complaints at end of session.     OBJECTIVE IMPAIRMENTS Abnormal gait, decreased activity tolerance,  decreased balance, difficulty walking, decreased strength, increased muscle spasms, and impaired tone.   ACTIVITY LIMITATIONS carrying, lifting, squatting, stairs, and transfers  PARTICIPATION LIMITATIONS: meal prep, cleaning, laundry, shopping, community activity, yard work, and church  PERSONAL FACTORS Age, Fitness, Past/current experiences, Time since onset of injury/illness/exacerbation, and 3+ comorbidities: asthma, MS, R patellar dislocation  are also affecting patient's functional outcome.   REHAB POTENTIAL: Good  CLINICAL DECISION MAKING: Evolving/moderate complexity  EVALUATION COMPLEXITY: Moderate  PLAN: PT FREQUENCY: 2x/week  PT DURATION: 4 weeks  PLANNED INTERVENTIONS: Therapeutic exercises, Therapeutic activity, Neuromuscular re-education, Balance training, Gait training, Patient/Family education, Self Care, Joint mobilization, Stair training, Vestibular training, Canalith repositioning, Aquatic Therapy, Dry Needling, Electrical stimulation, Cryotherapy, Moist heat, Taping, Manual therapy, and Re-evaluation  PLAN FOR NEXT SESSION: keep working on hip flexion and knee flexion strength; progress R quad and hip strength, high level balance activities;    Janene Harvey, PT, DPT 12/12/21 9:42 AM  [facility] at [facility] [address] [place], [place], [place] [address] Phone # [phone] Fax # [phone]

## 2021-12-12 ENCOUNTER — Ambulatory Visit: Payer: BC Managed Care – PPO | Admitting: Physical Therapy

## 2021-12-12 ENCOUNTER — Encounter: Payer: Self-pay | Admitting: Physical Therapy

## 2021-12-12 DIAGNOSIS — R2681 Unsteadiness on feet: Secondary | ICD-10-CM | POA: Diagnosis not present

## 2021-12-12 DIAGNOSIS — M6281 Muscle weakness (generalized): Secondary | ICD-10-CM | POA: Diagnosis not present

## 2021-12-12 DIAGNOSIS — R2689 Other abnormalities of gait and mobility: Secondary | ICD-10-CM

## 2021-12-15 NOTE — Therapy (Signed)
OUTPATIENT PHYSICAL THERAPY NEURO NOTE   Patient Name: Tara Ross MRN: [id number] DOB:05-Oct-1960, 61 y.o., female Today's Date: 12/16/2021   [facility]: [facility]  REFERRING PROVIDER: Ala Bent, MD   PT End of Session - 12/16/21 0929     Visit Number 13    Number of Visits 18    Date for PT Re-Evaluation 12/30/21    Authorization Type BCBS    Progress Note Due on Visit 36    PT Start Time 0845    PT Stop Time 0927    PT Time Calculation (min) 42 min    Equipment Utilized During Treatment Gait belt    Activity Tolerance Patient tolerated treatment well;Patient limited by pain    Behavior During Therapy WFL for tasks assessed/performed                         Past Medical History:  Diagnosis Date   Asthma    Basal cell carcinoma of scalp and skin of face    Cataract    Neuromuscular disorder [facility])    Past Surgical History:  Procedure Laterality Date   AUGMENTATION MAMMAPLASTY Bilateral    Patient Active Problem List   Diagnosis Date Noted   Foot pain, bilateral 02/20/2021    ONSET DATE: Last MS exacerbation in Spring 2023  REFERRING DIAG: G35 (ICD-10-CM) - Multiple sclerosis  THERAPY DIAG:  Muscle weakness (generalized)  Unsteadiness on feet  Other abnormalities of gait and mobility  Rationale for Evaluation and Treatment Rehabilitation  SUBJECTIVE:                                                                                                                                                                                              SUBJECTIVE STATEMENT: Did a hard workout for her legs yesterday.   Pt accompanied by: self  PERTINENT HISTORY: MS; pt reports chronic R patellar dislocation   PAIN:  Are you having pain? No  PRECAUTIONS: Fall  PATIENT GOALS improve balance and R LE strength   OBJECTIVE:     TODAY'S TREATMENT: 12/16/21 Activity Comments  Scifit L3.5 x 6 min UE/LEs   R HIT   positive  L HIT  negative   1/2 turns to targets + alt toe tap on cone  C/o ver mild wooziness with quick turns; mild imbalance  resisted walking backward with pulley x6 with 15# Cueing to widen BOS, control eccentric phase with large steps; CGA  resisted walking sideways with pulley x3 each with 10# R knee stiffness and occasional posterior LOB; CGA-min A  static lunges with  back foot on dynadisc  1 UE support; c/o R knee instability with R foot back- discontinued   R mod thomas stretch 2x30"           HOME EXERCISE PROGRAM Last updated: 11/28/21 Access Code: [email or web address] URL: [email or web address] Date: 11/28/2021 Prepared by: [facility]  Program Notes perform at a counter top or corner for safety  Exercises - Straight Leg Raise with Ankle Weight  - 1 x daily - 5 x weekly - 2 sets - 10 reps - Sidelying Hip Abduction  - 1 x daily - 5 x weekly - 2 sets - 10 reps - Tandem Walking with Counter Support  - 1 x daily - 5 x weekly - 2 sets - 10 reps - Side Stepping with Resistance at Feet  - 1 x daily - 5 x weekly - 2 sets - 10 reps - Ankle Inversion with Resistance  - 1 x daily - 5 x weekly - 2 sets - 10 reps - Ankle Eversion with Resistance  - 1 x daily - 5 x weekly - 2 sets - 10 reps - Seated Arch Lifts  - 1 x daily - 5 x weekly - 2 sets - 10 reps - Standing Calf Raise With Small Ball at Heels  - 1 x daily - 5 x weekly - 2 sets - 10 reps - Supine Figure 4 Piriformis Stretch  - 1 x daily - 5 x weekly - 2 sets - 30 sec hold - Modified Thomas Stretch  - 1 x daily - 5 x weekly - 2 sets - 30 sec hold - Supine Butterfly Groin Stretch  - 1 x daily - 5 x weekly - 2 sets - 30 sec hold - Marching with Resistance  - 1 x daily - 5 x weekly - 2 sets - 10 reps - Alternating Step Forward with Support  - 1 x daily - 5 x weekly - 2 sets - 10 reps - Lateral Step Down  - 1 x daily - 5 x weekly - 2 sets - 10 reps - Corner Balance Feet Apart: Eyes Closed  With Head Turns  - 1 x daily - 5 x weekly - 2-3 sets - 30 se hold - Romberg Stance Eyes Closed on Foam Pad  - 1 x daily - 5 x weekly - 2 sets - 30 sec hold - Romberg Stance on Foam Pad with Head Rotation  - 1 x daily - 5 x weekly - 2 sets - 30 sec hold - Romberg Stance with Head Nods on Foam Pad  - 1 x daily - 5 x weekly - 2 sets - 30 sec hold     Below measures were taken at time of initial evaluation unless otherwise specified:  DIAGNOSTIC FINDINGS: 08/12/21 brain MRI: No specific evidence of progressive demyelinating disease. No acute abnormality or active demyelination identified.  COGNITION: Overall cognitive status: Within functional limits for tasks assessed   SENSATION: WFL  COORDINATION: B Alt pron/supination intact; finger to nose intact; heel to shin intact   MUSCLE TONE: LLE: Mild and in L quad   POSTURE: No Significant postural limitations  LOWER EXTREMITY ROM:     Active  Right Eval Left Eval  Hip flexion    Hip extension    Hip abduction    Hip adduction    Hip internal rotation    Hip external rotation    Knee flexion    Knee extension  Ankle dorsiflexion 16 14  Ankle plantarflexion    Ankle inversion    Ankle eversion     (Blank rows = not tested)  LOWER EXTREMITY MMT:    MMT Right Eval Left Eval  Hip flexion 4 5  Hip extension    Hip abduction 4 4  Hip adduction 4+ 4+  Hip internal rotation    Hip external rotation    Knee flexion 4 4+  Knee extension 4 4+  Ankle dorsiflexion 4+ 4+  Ankle plantarflexion 4 (completed 20 reps with heavy anterior trunk lean) 4 (completed 20 reps with heavy anterior trunk lean)  Ankle inversion    Ankle eversion    (Blank rows = not tested)   GAIT: Gait pattern:  decreased L step length and R LE with reduced knee flexion through swing  Level of assistance: Complete Independence   PATIENT EDUCATION: Education details: exam findings, prognosis, POC, HEP Person educated: Patient Education method:  Explanation, Demonstration, Tactile cues, Verbal cues, and Handouts Education comprehension: verbalized understanding and returned demonstration   HOME EXERCISE PROGRAM: Access Code: [email or web address] URL: [email or web address] Date: 10/24/2021 Prepared by: [facility]  Program Notes perform at a counter top or corner for safety  Exercises - Standing Terminal Knee Extension with Resistance  - 1 x daily - 5 x weekly - 2 sets - 10 reps - Sit to Stand with Arms Crossed  - 1 x daily - 5 x weekly - 2 sets - 10 reps - Sidelying Hip Abduction  - 1 x daily - 5 x weekly - 2 sets - 10 reps - Tandem Walking with Counter Support  - 1 x daily - 5 x weekly - 2 sets - 10 reps - Backward Walking with Counter Support  - 1 x daily - 5 x weekly - 2 sets - 10 reps - Romberg Stance with Eyes Closed  - 1 x daily - 5 x weekly - 2 sets - 30 sec hold   GOALS: Goals reviewed with patient? Yes  SHORT TERM GOALS: Target date: 11/14/2021  Patient to be independent with initial HEP. Baseline: HEP initiated Goal status: MET    LONG TERM GOALS: Target date: 12/30/2021  Patient to be independent with advanced HEP. Baseline: Not yet initiated  Goal status: IN PROGRESS  Patient to demonstrate alternating reciprocal pattern when ascending and descending stairs with good stability and 1 handrail as needed.   Baseline: step-to pattern; 12/02/21 able to perform step through without handrail  Goal status: MET  Patient to score at least 24/30 on FGA in order to decrease risk of falls.  Baseline: 20/30; 23/30 12/02/21 Goal status: IN PROGRESS  Patient to demonstrate gait speed of 3.44 ft/sec in order to meet MCID. Baseline: 2.62 ft/sec; 2.51 ft/sec 12/02/21 Goal status: IN PROGRESS   Patient to demonstrate 5xSTS test in <15 sec and with good eccentric control in order to demonstrate improved functional strength. Baseline: poor eccentric control; 9 sec with limited  eccentric control 12/02/21 Goal status: IN PROGRESS   ASSESSMENT:  CLINICAL IMPRESSION: Patient arrived to session with report of some LE soreness and R knee stiffness from a workout the previous day. Retested HIT today which revealed positive on the R side. Proceeded to work on dynamic balance activities and resisted walking for balance and stability. Patient with some increased imbalance and difficulty on R vs. L side. Attempted lunges with compliant challenge- limited by sensation of R knee instability, thus this was discontinued.  Ended session with gentle stretching to address discomfort. Patient reported understanding of all edu provided and without complaints at end of session.      OBJECTIVE IMPAIRMENTS Abnormal gait, decreased activity tolerance, decreased balance, difficulty walking, decreased strength, increased muscle spasms, and impaired tone.   ACTIVITY LIMITATIONS carrying, lifting, squatting, stairs, and transfers  PARTICIPATION LIMITATIONS: meal prep, cleaning, laundry, shopping, community activity, yard work, and church  PERSONAL FACTORS Age, Fitness, Past/current experiences, Time since onset of injury/illness/exacerbation, and 3+ comorbidities: asthma, MS, R patellar dislocation  are also affecting patient's functional outcome.   REHAB POTENTIAL: Good  CLINICAL DECISION MAKING: Evolving/moderate complexity  EVALUATION COMPLEXITY: Moderate  PLAN: PT FREQUENCY: 2x/week  PT DURATION: 4 weeks  PLANNED INTERVENTIONS: Therapeutic exercises, Therapeutic activity, Neuromuscular re-education, Balance training, Gait training, Patient/Family education, Self Care, Joint mobilization, Stair training, Vestibular training, Canalith repositioning, Aquatic Therapy, Dry Needling, Electrical stimulation, Cryotherapy, Moist heat, Taping, Manual therapy, and Re-evaluation  PLAN FOR NEXT SESSION: keep working on hip flexion and knee flexion strength; progress R quad and hip strength,  high level balance activities;    Janene Harvey, PT, DPT 12/16/21 9:32 AM  [facility] at [facility] [address] [place], [place], [place] [address] Phone # [phone] Fax # [phone]

## 2021-12-16 ENCOUNTER — Encounter: Payer: Self-pay | Admitting: Physical Therapy

## 2021-12-16 ENCOUNTER — Ambulatory Visit: Payer: BC Managed Care – PPO | Admitting: Physical Therapy

## 2021-12-16 DIAGNOSIS — R2681 Unsteadiness on feet: Secondary | ICD-10-CM | POA: Diagnosis not present

## 2021-12-16 DIAGNOSIS — R2689 Other abnormalities of gait and mobility: Secondary | ICD-10-CM

## 2021-12-16 DIAGNOSIS — M6281 Muscle weakness (generalized): Secondary | ICD-10-CM

## 2021-12-18 ENCOUNTER — Ambulatory Visit: Payer: BC Managed Care – PPO | Admitting: Physical Therapy

## 2021-12-18 ENCOUNTER — Encounter: Payer: Self-pay | Admitting: Physical Therapy

## 2021-12-18 DIAGNOSIS — M6281 Muscle weakness (generalized): Secondary | ICD-10-CM

## 2021-12-18 DIAGNOSIS — R2681 Unsteadiness on feet: Secondary | ICD-10-CM

## 2021-12-18 DIAGNOSIS — R2689 Other abnormalities of gait and mobility: Secondary | ICD-10-CM | POA: Diagnosis not present

## 2021-12-18 NOTE — Therapy (Signed)
OUTPATIENT PHYSICAL THERAPY NEURO NOTE   Patient Name: Tara Ross MRN: [id number] DOB:01/06/1961, 61 y.o., female Today's Date: 12/18/2021   PCP: [facility]  REFERRING PROVIDER: Ala Bent, MD   PT End of Session - 12/18/21 1013     Visit Number 14    Number of Visits 18    Date for PT Re-Evaluation 12/30/21    Authorization Type BCBS    Progress Note Due on Visit 25    PT Start Time 0933    PT Stop Time 1012    PT Time Calculation (min) 39 min    Equipment Utilized During Treatment Gait belt    Activity Tolerance Patient tolerated treatment well    Behavior During Therapy WFL for tasks assessed/performed                          Past Medical History:  Diagnosis Date   Asthma    Basal cell carcinoma of scalp and skin of face    Cataract    Neuromuscular disorder [facility])    Past Surgical History:  Procedure Laterality Date   AUGMENTATION MAMMAPLASTY Bilateral    Patient Active Problem List   Diagnosis Date Noted   Foot pain, bilateral 02/20/2021    ONSET DATE: Last MS exacerbation in Spring 2023  REFERRING DIAG: G35 (ICD-10-CM) - Multiple sclerosis  THERAPY DIAG:  Muscle weakness (generalized)  Unsteadiness on feet  Other abnormalities of gait and mobility  Rationale for Evaluation and Treatment Rehabilitation  SUBJECTIVE:                                                                                                                                                                                              SUBJECTIVE STATEMENT: R knee is a little sore. Reports that it gave way on her yesterday.  Pt accompanied by: self  PERTINENT HISTORY: MS; pt reports chronic R patellar dislocation   PAIN:  Are you having pain? Yes: NPRS scale: 3/10 Pain location: R knee Pain description: sore Aggravating factors: gait Relieving factors: time  PRECAUTIONS: Fall  PATIENT GOALS improve balance and R LE  strength   OBJECTIVE:     TODAY'S TREATMENT: 12/18/21 Activity Comments  Scifit Ues/Les L3.5 x 6 min   supine R TFL stretch with strap 2x30" With strap; good tolerance   Bridge ball 2x15 Mild hip instability   R SLR ER 10x C/o pull on medial knee  standing TFL stretch x30" Difficulty attaining stretch   standing on foam bean + green medball trunk rotation EO and EC 1 posterior LOB  overhead 5# carry in each hand 1 min Mild instability; cues to increase R knee flexion in swing   Reactive balance kicking tennis ball back and forth with each foot In II bars with occasional UE support      HOME EXERCISE PROGRAM Last updated: 12/18/21 Access Code: [email or web address] URL: [email or web address] Date: 12/18/2021 Prepared by: [facility]  Program Notes perform at a counter top or corner for safety  Exercises - Ankle Inversion with Resistance  - 1 x daily - 5 x weekly - 2 sets - 10 reps - Ankle Eversion with Resistance  - 1 x daily - 5 x weekly - 2 sets - 10 reps - Seated Arch Lifts  - 1 x daily - 5 x weekly - 2 sets - 10 reps - Standing Calf Raise With Small Ball at [place]  - 1 x daily - 5 x weekly - 2 sets - 10 reps - Straight Leg Raise with External Rotation  - 1 x daily - 5 x weekly - 2 sets - 10 reps - Sidelying Hip Abduction  - 1 x daily - 5 x weekly - 2 sets - 10 reps - Side Stepping with Resistance at Feet  - 1 x daily - 5 x weekly - 2 sets - 10 reps - Supine Figure 4 Piriformis Stretch  - 1 x daily - 5 x weekly - 2 sets - 30 sec hold - Modified Thomas Stretch  - 1 x daily - 5 x weekly - 2 sets - 30 sec hold - Supine ITB Stretch with Strap  - 1 x daily - 5 x weekly - 2 sets - 30 sec hold - Supine Butterfly Groin Stretch  - 1 x daily - 5 x weekly - 2 sets - 30 sec hold - Marching with Resistance  - 1 x daily - 5 x weekly - 2 sets - 10 reps - Lateral Step Down  - 1 x daily - 5 x weekly - 2 sets - 10 reps - Tandem Walking with Counter  Support  - 1 x daily - 5 x weekly - 2 sets - 10 reps - Alternating Step Forward with Support  - 1 x daily - 5 x weekly - 2 sets - 10 reps - Corner Balance Feet Apart: Eyes Closed With Head Turns  - 1 x daily - 5 x weekly - 2-3 sets - 30 se hold - Romberg Stance Eyes Closed on Foam Pad  - 1 x daily - 5 x weekly - 2 sets - 30 sec hold - Romberg Stance on Foam Pad with Head Rotation  - 1 x daily - 5 x weekly - 2 sets - 30 sec hold - Romberg Stance with Head Nods on Foam Pad  - 1 x daily - 5 x weekly - 2 sets - 30 sec hold   PATIENT EDUCATION: Education details: update to HEP Person educated: Patient Education method: Explanation, Demonstration, Tactile cues, Verbal cues, and Handouts Education comprehension: verbalized understanding and returned demonstration    Below measures were taken at time of initial evaluation unless otherwise specified:  DIAGNOSTIC FINDINGS: 08/12/21 brain MRI: No specific evidence of progressive demyelinating disease. No acute abnormality or active demyelination identified.  COGNITION: Overall cognitive status: Within functional limits for tasks assessed   SENSATION: WFL  COORDINATION: B Alt pron/supination intact; finger to nose intact; heel to shin intact   MUSCLE TONE: LLE: Mild and in L quad   POSTURE: No  Significant postural limitations  LOWER EXTREMITY ROM:     Active  Right Eval Left Eval  Hip flexion    Hip extension    Hip abduction    Hip adduction    Hip internal rotation    Hip external rotation    Knee flexion    Knee extension    Ankle dorsiflexion 16 14  Ankle plantarflexion    Ankle inversion    Ankle eversion     (Blank rows = not tested)  LOWER EXTREMITY MMT:    MMT Right Eval Left Eval  Hip flexion 4 5  Hip extension    Hip abduction 4 4  Hip adduction 4+ 4+  Hip internal rotation    Hip external rotation    Knee flexion 4 4+  Knee extension 4 4+  Ankle dorsiflexion 4+ 4+  Ankle plantarflexion 4 (completed 20  reps with heavy anterior trunk lean) 4 (completed 20 reps with heavy anterior trunk lean)  Ankle inversion    Ankle eversion    (Blank rows = not tested)   GAIT: Gait pattern:  decreased L step length and R LE with reduced knee flexion through swing  Level of assistance: Complete Independence   PATIENT EDUCATION: Education details: exam findings, prognosis, POC, HEP Person educated: Patient Education method: Explanation, Demonstration, Tactile cues, Verbal cues, and Handouts Education comprehension: verbalized understanding and returned demonstration   HOME EXERCISE PROGRAM: Access Code: [email or web address] URL: [email or web address] Date: 10/24/2021 Prepared by: [facility]  Program Notes perform at a counter top or corner for safety  Exercises - Standing Terminal Knee Extension with Resistance  - 1 x daily - 5 x weekly - 2 sets - 10 reps - Sit to Stand with Arms Crossed  - 1 x daily - 5 x weekly - 2 sets - 10 reps - Sidelying Hip Abduction  - 1 x daily - 5 x weekly - 2 sets - 10 reps - Tandem Walking with Counter Support  - 1 x daily - 5 x weekly - 2 sets - 10 reps - Backward Walking with Counter Support  - 1 x daily - 5 x weekly - 2 sets - 10 reps - Romberg Stance with Eyes Closed  - 1 x daily - 5 x weekly - 2 sets - 30 sec hold   GOALS: Goals reviewed with patient? Yes  SHORT TERM GOALS: Target date: 11/14/2021  Patient to be independent with initial HEP. Baseline: HEP initiated Goal status: MET    LONG TERM GOALS: Target date: 12/30/2021  Patient to be independent with advanced HEP. Baseline: Not yet initiated  Goal status: IN PROGRESS  Patient to demonstrate alternating reciprocal pattern when ascending and descending stairs with good stability and 1 handrail as needed.   Baseline: step-to pattern; 12/02/21 able to perform step through without handrail  Goal status: MET  Patient to score at least 24/30 on FGA in  order to decrease risk of falls.  Baseline: 20/30; 23/30 12/02/21 Goal status: IN PROGRESS  Patient to demonstrate gait speed of 3.44 ft/sec in order to meet MCID. Baseline: 2.62 ft/sec; 2.51 ft/sec 12/02/21 Goal status: IN PROGRESS   Patient to demonstrate 5xSTS test in <15 sec and with good eccentric control in order to demonstrate improved functional strength. Baseline: poor eccentric control; 9 sec with limited eccentric control 12/02/21 Goal status: IN PROGRESS   ASSESSMENT:  CLINICAL IMPRESSION: Patient arrived to session with report of some R knee soreness and  1 episode of R knee buckling since last session. Worked on gentle TFL stretch and VMO strengthening to address patellar instability. Patient tolerated these activities well. Worked on reactive balance activities with fairly good stability and only occasional UE support. Patient tolerated session well and without complaints upon leaving.      OBJECTIVE IMPAIRMENTS Abnormal gait, decreased activity tolerance, decreased balance, difficulty walking, decreased strength, increased muscle spasms, and impaired tone.   ACTIVITY LIMITATIONS carrying, lifting, squatting, stairs, and transfers  PARTICIPATION LIMITATIONS: meal prep, cleaning, laundry, shopping, community activity, yard work, and church  PERSONAL FACTORS Age, Fitness, Past/current experiences, Time since onset of injury/illness/exacerbation, and 3+ comorbidities: asthma, MS, R patellar dislocation  are also affecting patient's functional outcome.   REHAB POTENTIAL: Good  CLINICAL DECISION MAKING: Evolving/moderate complexity  EVALUATION COMPLEXITY: Moderate  PLAN: PT FREQUENCY: 2x/week  PT DURATION: 4 weeks  PLANNED INTERVENTIONS: Therapeutic exercises, Therapeutic activity, Neuromuscular re-education, Balance training, Gait training, Patient/Family education, Self Care, Joint mobilization, Stair training, Vestibular training, Canalith repositioning, Aquatic  Therapy, Dry Needling, Electrical stimulation, Cryotherapy, Moist heat, Taping, Manual therapy, and Re-evaluation  PLAN FOR NEXT SESSION: keep working on hip flexion and knee flexion strength; progress R quad and hip strength, high level balance activities;    Janene Harvey, PT, DPT 12/18/21 10:36 AM  [facility] at [facility] [address] [place], [place], [place] [address] Phone # [phone] Fax # [phone]

## 2021-12-22 DIAGNOSIS — G35 Multiple sclerosis: Secondary | ICD-10-CM | POA: Diagnosis not present

## 2021-12-22 DIAGNOSIS — Z7969 Long term (current) use of other immunomodulators and immunosuppressants: Secondary | ICD-10-CM | POA: Diagnosis not present

## 2021-12-22 NOTE — Therapy (Signed)
OUTPATIENT PHYSICAL THERAPY NEURO NOTE   Patient Name: Tara Ross MRN: [id number] DOB:1960-05-28, 61 y.o., female Today's Date: 12/23/2021   [facility]: [facility]  REFERRING PROVIDER: Ala Bent, MD   PT End of Session - 12/23/21 0930     Visit Number 15    Number of Visits 18    Date for PT Re-Evaluation 12/30/21    Authorization Type BCBS    Progress Note Due on Visit 54    PT Start Time 0845    PT Stop Time 0927    PT Time Calculation (min) 42 min    Equipment Utilized During Treatment Gait belt    Activity Tolerance Patient tolerated treatment well    Behavior During Therapy WFL for tasks assessed/performed                           Past Medical History:  Diagnosis Date   Asthma    Basal cell carcinoma of scalp and skin of face    Cataract    Neuromuscular disorder [facility])    Past Surgical History:  Procedure Laterality Date   AUGMENTATION MAMMAPLASTY Bilateral    Patient Active Problem List   Diagnosis Date Noted   Foot pain, bilateral 02/20/2021    ONSET DATE: Last MS exacerbation in Spring 2023  REFERRING DIAG: G35 (ICD-10-CM) - Multiple sclerosis  THERAPY DIAG:  Muscle weakness (generalized)  Unsteadiness on feet  Other abnormalities of gait and mobility  Rationale for Evaluation and Treatment Rehabilitation  SUBJECTIVE:                                                                                                                                                                                              SUBJECTIVE STATEMENT: R knee is feeling better. Saw neurologist yesterday.   Pt accompanied by: self  PERTINENT HISTORY: MS; pt reports chronic R patellar dislocation   PAIN:  Are you having pain? No  PRECAUTIONS: Fall  PATIENT GOALS improve balance and R LE strength   OBJECTIVE:    TODAY'S TREATMENT: 12/23/21 Activity Comments  Scitfit L3.5 x 6 min   staggered stance row 20# 2x10   Cueing to shift onto front foot   straight arm pulldown 20# 2x10  Cues to maintain elbows straight   staggered stance lat pulldown 25# 2x10  Cues to contract core; good stability   staggered stance paloff press 10# x10 each CGA for safety  gait training outside with gait+ head turns/nods/matching PT's speed  CGA-min A d/t imbalance, worst with head nods     PATIENT EDUCATION:  Education details: discussion on POC and encouraged continued HEP, physical training, and DC at next visit d/t good progress  Person educated: Patient Education method: Explanation, Demonstration, Tactile cues, Verbal cues, and Handouts Education comprehension: verbalized understanding and returned demonstration   HOME EXERCISE PROGRAM Last updated: 12/18/21 Access Code: [email or web address] URL: [email or web address] Date: 12/18/2021 Prepared by: [facility]  Program Notes perform at a counter top or corner for safety  Exercises - Ankle Inversion with Resistance  - 1 x daily - 5 x weekly - 2 sets - 10 reps - Ankle Eversion with Resistance  - 1 x daily - 5 x weekly - 2 sets - 10 reps - Seated Arch Lifts  - 1 x daily - 5 x weekly - 2 sets - 10 reps - Standing Calf Raise With Small Ball at [place]  - 1 x daily - 5 x weekly - 2 sets - 10 reps - Straight Leg Raise with External Rotation  - 1 x daily - 5 x weekly - 2 sets - 10 reps - Sidelying Hip Abduction  - 1 x daily - 5 x weekly - 2 sets - 10 reps - Side Stepping with Resistance at Feet  - 1 x daily - 5 x weekly - 2 sets - 10 reps - Supine Figure 4 Piriformis Stretch  - 1 x daily - 5 x weekly - 2 sets - 30 sec hold - Modified Thomas Stretch  - 1 x daily - 5 x weekly - 2 sets - 30 sec hold - Supine ITB Stretch with Strap  - 1 x daily - 5 x weekly - 2 sets - 30 sec hold - Supine Butterfly Groin Stretch  - 1 x daily - 5 x weekly - 2 sets - 30 sec hold - Marching with Resistance  - 1 x daily - 5 x weekly - 2 sets - 10 reps -  Lateral Step Down  - 1 x daily - 5 x weekly - 2 sets - 10 reps - Tandem Walking with Counter Support  - 1 x daily - 5 x weekly - 2 sets - 10 reps - Alternating Step Forward with Support  - 1 x daily - 5 x weekly - 2 sets - 10 reps - Corner Balance Feet Apart: Eyes Closed With Head Turns  - 1 x daily - 5 x weekly - 2-3 sets - 30 se hold - Romberg Stance Eyes Closed on Foam Pad  - 1 x daily - 5 x weekly - 2 sets - 30 sec hold - Romberg Stance on Foam Pad with Head Rotation  - 1 x daily - 5 x weekly - 2 sets - 30 sec hold - Romberg Stance with Head Nods on Foam Pad  - 1 x daily - 5 x weekly - 2 sets - 30 sec hold    Below measures were taken at time of initial evaluation unless otherwise specified:  DIAGNOSTIC FINDINGS: 08/12/21 brain MRI: No specific evidence of progressive demyelinating disease. No acute abnormality or active demyelination identified.  COGNITION: Overall cognitive status: Within functional limits for tasks assessed   SENSATION: WFL  COORDINATION: B Alt pron/supination intact; finger to nose intact; heel to shin intact   MUSCLE TONE: LLE: Mild and in L quad   POSTURE: No Significant postural limitations  LOWER EXTREMITY ROM:     Active  Right Eval Left Eval  Hip flexion    Hip extension    Hip abduction  Hip adduction    Hip internal rotation    Hip external rotation    Knee flexion    Knee extension    Ankle dorsiflexion 16 14  Ankle plantarflexion    Ankle inversion    Ankle eversion     (Blank rows = not tested)  LOWER EXTREMITY MMT:    MMT Right Eval Left Eval  Hip flexion 4 5  Hip extension    Hip abduction 4 4  Hip adduction 4+ 4+  Hip internal rotation    Hip external rotation    Knee flexion 4 4+  Knee extension 4 4+  Ankle dorsiflexion 4+ 4+  Ankle plantarflexion 4 (completed 20 reps with heavy anterior trunk lean) 4 (completed 20 reps with heavy anterior trunk lean)  Ankle inversion    Ankle eversion    (Blank rows = not  tested)   GAIT: Gait pattern:  decreased L step length and R LE with reduced knee flexion through swing  Level of assistance: Complete Independence   PATIENT EDUCATION: Education details: exam findings, prognosis, POC, HEP Person educated: Patient Education method: Explanation, Demonstration, Tactile cues, Verbal cues, and Handouts Education comprehension: verbalized understanding and returned demonstration   HOME EXERCISE PROGRAM: Access Code: [email or web address] URL: [email or web address] Date: 10/24/2021 Prepared by: [facility]  Program Notes perform at a counter top or corner for safety  Exercises - Standing Terminal Knee Extension with Resistance  - 1 x daily - 5 x weekly - 2 sets - 10 reps - Sit to Stand with Arms Crossed  - 1 x daily - 5 x weekly - 2 sets - 10 reps - Sidelying Hip Abduction  - 1 x daily - 5 x weekly - 2 sets - 10 reps - Tandem Walking with Counter Support  - 1 x daily - 5 x weekly - 2 sets - 10 reps - Backward Walking with Counter Support  - 1 x daily - 5 x weekly - 2 sets - 10 reps - Romberg Stance with Eyes Closed  - 1 x daily - 5 x weekly - 2 sets - 30 sec hold   GOALS: Goals reviewed with patient? Yes  SHORT TERM GOALS: Target date: 11/14/2021  Patient to be independent with initial HEP. Baseline: HEP initiated Goal status: MET    LONG TERM GOALS: Target date: 12/30/2021  Patient to be independent with advanced HEP. Baseline: Not yet initiated  Goal status: IN PROGRESS  Patient to demonstrate alternating reciprocal pattern when ascending and descending stairs with good stability and 1 handrail as needed.   Baseline: step-to pattern; 12/02/21 able to perform step through without handrail  Goal status: MET  Patient to score at least 24/30 on FGA in order to decrease risk of falls.  Baseline: 20/30; 23/30 12/02/21 Goal status: IN PROGRESS  Patient to demonstrate gait speed of 3.44 ft/sec in  order to meet MCID. Baseline: 2.62 ft/sec; 2.51 ft/sec 12/02/21 Goal status: IN PROGRESS   Patient to demonstrate 5xSTS test in <15 sec and with good eccentric control in order to demonstrate improved functional strength. Baseline: poor eccentric control; 9 sec with limited eccentric control 12/02/21 Goal status: IN PROGRESS   ASSESSMENT:  CLINICAL IMPRESSION: Patient arrived to session with report of improved R knee pain. Worked on resisted strength training in staggered stance for additional balance challenge. Patient required occasional CGA with resisted strengthening for safety. Demonstrated good core stability and control. Balance challenges with walking and head turns/nods  revealed instability which required occasional min A. Patient agreeable to D/C next session d/t progress with therapy.      OBJECTIVE IMPAIRMENTS Abnormal gait, decreased activity tolerance, decreased balance, difficulty walking, decreased strength, increased muscle spasms, and impaired tone.   ACTIVITY LIMITATIONS carrying, lifting, squatting, stairs, and transfers  PARTICIPATION LIMITATIONS: meal prep, cleaning, laundry, shopping, community activity, yard work, and church  PERSONAL FACTORS Age, Fitness, Past/current experiences, Time since onset of injury/illness/exacerbation, and 3+ comorbidities: asthma, MS, R patellar dislocation  are also affecting patient's functional outcome.   REHAB POTENTIAL: Good  CLINICAL DECISION MAKING: Evolving/moderate complexity  EVALUATION COMPLEXITY: Moderate  PLAN: PT FREQUENCY: 2x/week  PT DURATION: 4 weeks  PLANNED INTERVENTIONS: Therapeutic exercises, Therapeutic activity, Neuromuscular re-education, Balance training, Gait training, Patient/Family education, Self Care, Joint mobilization, Stair training, Vestibular training, Canalith repositioning, Aquatic Therapy, Dry Needling, Electrical stimulation, Cryotherapy, Moist heat, Taping, Manual therapy, and  Re-evaluation  PLAN FOR NEXT SESSION: keep working on hip flexion and knee flexion strength; progress R quad and hip strength, high level balance activities;    Janene Harvey, PT, DPT 12/23/21 9:31 AM  [facility] at [facility] [address] [place], [place], [place] [address] Phone # [phone] Fax # [phone]

## 2021-12-23 ENCOUNTER — Encounter: Payer: Self-pay | Admitting: Physical Therapy

## 2021-12-23 ENCOUNTER — Ambulatory Visit: Payer: BC Managed Care – PPO | Attending: Neurology | Admitting: Physical Therapy

## 2021-12-23 DIAGNOSIS — M6281 Muscle weakness (generalized): Secondary | ICD-10-CM | POA: Diagnosis not present

## 2021-12-23 DIAGNOSIS — R2681 Unsteadiness on feet: Secondary | ICD-10-CM | POA: Diagnosis not present

## 2021-12-23 DIAGNOSIS — R2689 Other abnormalities of gait and mobility: Secondary | ICD-10-CM | POA: Diagnosis not present

## 2021-12-24 ENCOUNTER — Ambulatory Visit: Payer: BC Managed Care – PPO | Admitting: Physical Therapy

## 2021-12-30 NOTE — Therapy (Signed)
OUTPATIENT PHYSICAL THERAPY NEURO DISCHARGE SUMMARY   Patient Name: Tara Ross MRN: [id number] DOB:[birth year], 61 y.o., female Today's Date: 12/31/2021   PCP: [facility]  REFERRING PROVIDER: Ala Bent, MD   PT End of Session - 12/31/21 1106     Visit Number 16    Number of Visits 18    Date for PT Re-Evaluation 12/30/21    Authorization Type BCBS    Progress Note Due on Visit 29    PT Start Time 0846    PT Stop Time 0928    PT Time Calculation (min) 42 min    Equipment Utilized During Treatment Gait belt    Activity Tolerance Patient tolerated treatment well    Behavior During Therapy WFL for tasks assessed/performed                            Past Medical History:  Diagnosis Date   Asthma    Basal cell carcinoma of scalp and skin of face    Cataract    Neuromuscular disorder [facility])    Past Surgical History:  Procedure Laterality Date   AUGMENTATION MAMMAPLASTY Bilateral    Patient Active Problem List   Diagnosis Date Noted   Foot pain, bilateral 02/20/2021    ONSET DATE: Last MS exacerbation in Spring 2023  REFERRING DIAG: G35 (ICD-10-CM) - Multiple sclerosis  THERAPY DIAG:  Muscle weakness (generalized)  Unsteadiness on feet  Other abnormalities of gait and mobility  Rationale for Evaluation and Treatment Rehabilitation  SUBJECTIVE:                                                                                                                                                                                              SUBJECTIVE STATEMENT: "Sad that it's my last day. Wish I could make you a part of my weekly routine." Knee feels a little better after giving it a break for a few days.   Pt accompanied by: self  PERTINENT HISTORY: MS; pt reports chronic R patellar dislocation   PAIN:  Are you having pain? No  PRECAUTIONS: Fall  PATIENT GOALS improve balance and R LE strength   OBJECTIVE:      TODAY'S TREATMENT: 12/31/21 Activity Comments  Scifit L3.5 x 6 min    FGA 24/30  31M walk 10.4 sec  5xSTS 10.11 sec without Ues and with improved eccentric control when prompted    [facility] PT Assessment - 12/31/21 0001       Standardized Balance Assessment   10 Meter Walk 10.4 sec   3.15 ft/sec  Functional Gait  Assessment   Gait Level Surface Walks 20 ft in less than 5.5 sec, no assistive devices, good speed, no evidence for imbalance, normal gait pattern, deviates no more than 6 in outside of the 12 in walkway width.    Change in Gait Speed Able to change speed, demonstrates mild gait deviations, deviates 6-10 in outside of the 12 in walkway width, or no gait deviations, unable to achieve a major change in velocity, or uses a change in velocity, or uses an assistive device.    Gait with Horizontal Head Turns Performs head turns smoothly with slight change in gait velocity (eg, minor disruption to smooth gait path), deviates 6-10 in outside 12 in walkway width, or uses an assistive device.    Gait with Vertical Head Turns Performs head turns with no change in gait. Deviates no more than 6 in outside 12 in walkway width.    Gait and Pivot Turn Pivot turns safely within 3 sec and stops quickly with no loss of balance.    Step Over Obstacle Is able to step over one shoe box (4.5 in total height) without changing gait speed. No evidence of imbalance.    Gait with Narrow Base of Support Ambulates 4-7 steps.    Gait with Eyes Closed Walks 20 ft, uses assistive device, slower speed, mild gait deviations, deviates 6-10 in outside 12 in walkway width. Ambulates 20 ft in less than 9 sec but greater than 7 sec.    Ambulating Backwards Walks 20 ft, no assistive devices, good speed, no evidence for imbalance, normal gait    Steps Alternating feet, no rail.    Total Score 24              PATIENT EDUCATION: Education details: detailed review of HEP Person educated: Patient Education  method: Explanation, Tactile cues, Verbal cues, and Handouts Education comprehension: verbalized understanding    HOME EXERCISE PROGRAM Last updated: 12/31/21 Access Code: [email or web address] URL: [email or web address] Date: 12/31/2021 Prepared by: [facility]  Program Notes perform at a counter top or corner for safety  Exercises - Ankle Inversion with Resistance  - 1 x daily - 5 x weekly - 2 sets - 10 reps - Ankle Eversion with Resistance  - 1 x daily - 5 x weekly - 2 sets - 10 reps - Seated Arch Lifts  - 1 x daily - 5 x weekly - 2 sets - 10 reps - Standing Calf Raise With Small Ball at [place]  - 1 x daily - 5 x weekly - 2 sets - 10 reps - Straight Leg Raise with External Rotation  - 1 x daily - 5 x weekly - 2 sets - 10 reps - Sidelying Hip Abduction  - 1 x daily - 5 x weekly - 2 sets - 10 reps - Side Stepping with Resistance at Feet  - 1 x daily - 5 x weekly - 2 sets - 10 reps - Supine Figure 4 Piriformis Stretch  - 1 x daily - 5 x weekly - 2 sets - 30 sec hold - Modified Thomas Stretch  - 1 x daily - 5 x weekly - 2 sets - 30 sec hold - Supine ITB Stretch with Strap  - 1 x daily - 5 x weekly - 2 sets - 30 sec hold - Supine Butterfly Groin Stretch  - 1 x daily - 5 x weekly - 2 sets - 30 sec hold - Marching with Resistance  -  1 x daily - 5 x weekly - 2 sets - 10 reps - Lateral Step Down  - 1 x daily - 5 x weekly - 2 sets - 10 reps - Tandem Walking with Counter Support  - 1 x daily - 5 x weekly - 2 sets - 10 reps - Alternating Step Forward with Support  - 1 x daily - 5 x weekly - 2 sets - 10 reps - Corner Balance Feet Apart: Eyes Closed With Head Turns  - 1 x daily - 5 x weekly - 2-3 sets - 30 se hold - Romberg Stance Eyes Closed on Foam Pad  - 1 x daily - 5 x weekly - 2 sets - 30 sec hold - Romberg Stance on Foam Pad with Head Rotation  - 1 x daily - 5 x weekly - 2 sets - 30 sec hold - Romberg Stance with Head Nods on Foam Pad  - 1 x daily - 5  x weekly - 2 sets - 30 sec hold    Below measures were taken at time of initial evaluation unless otherwise specified:  DIAGNOSTIC FINDINGS: 08/12/21 brain MRI: No specific evidence of progressive demyelinating disease. No acute abnormality or active demyelination identified.  COGNITION: Overall cognitive status: Within functional limits for tasks assessed   SENSATION: WFL  COORDINATION: B Alt pron/supination intact; finger to nose intact; heel to shin intact   MUSCLE TONE: LLE: Mild and in L quad   POSTURE: No Significant postural limitations  LOWER EXTREMITY ROM:     Active  Right Eval Left Eval  Hip flexion    Hip extension    Hip abduction    Hip adduction    Hip internal rotation    Hip external rotation    Knee flexion    Knee extension    Ankle dorsiflexion 16 14  Ankle plantarflexion    Ankle inversion    Ankle eversion     (Blank rows = not tested)  LOWER EXTREMITY MMT:    MMT Right Eval Left Eval  Hip flexion 4 5  Hip extension    Hip abduction 4 4  Hip adduction 4+ 4+  Hip internal rotation    Hip external rotation    Knee flexion 4 4+  Knee extension 4 4+  Ankle dorsiflexion 4+ 4+  Ankle plantarflexion 4 (completed 20 reps with heavy anterior trunk lean) 4 (completed 20 reps with heavy anterior trunk lean)  Ankle inversion    Ankle eversion    (Blank rows = not tested)   GAIT: Gait pattern:  decreased L step length and R LE with reduced knee flexion through swing  Level of assistance: Complete Independence   PATIENT EDUCATION: Education details: exam findings, prognosis, POC, HEP Person educated: Patient Education method: Explanation, Demonstration, Tactile cues, Verbal cues, and Handouts Education comprehension: verbalized understanding and returned demonstration   HOME EXERCISE PROGRAM: Access Code: [email or web address] URL: [email or web address] Date: 10/24/2021 Prepared by: [facility]  Program Notes perform at a counter top or corner for safety  Exercises - Standing Terminal Knee Extension with Resistance  - 1 x daily - 5 x weekly - 2 sets - 10 reps - Sit to Stand with Arms Crossed  - 1 x daily - 5 x weekly - 2 sets - 10 reps - Sidelying Hip Abduction  - 1 x daily - 5 x weekly - 2 sets - 10 reps - Tandem Walking with Counter Support  -  1 x daily - 5 x weekly - 2 sets - 10 reps - Backward Walking with Counter Support  - 1 x daily - 5 x weekly - 2 sets - 10 reps - Romberg Stance with Eyes Closed  - 1 x daily - 5 x weekly - 2 sets - 30 sec hold   GOALS: Goals reviewed with patient? Yes  SHORT TERM GOALS: Target date: 11/14/2021  Patient to be independent with initial HEP. Baseline: HEP initiated Goal status: MET    LONG TERM GOALS: Target date: 12/30/2021  Patient to be independent with advanced HEP. Baseline: Not yet initiated  Goal status: MET 12/31/21  Patient to demonstrate alternating reciprocal pattern when ascending and descending stairs with good stability and 1 handrail as needed.   Baseline: step-to pattern; 12/02/21 able to perform step through without handrail  Goal status: MET   Patient to score at least 24/30 on FGA in order to decrease risk of falls.  Baseline: 20/30; 23/30 12/02/21, 24/30 12/31/21 Goal status: MET 12/31/21  Patient to demonstrate gait speed of 3.44 ft/sec in order to meet MCID. Baseline: 2.62 ft/sec; 2.51 ft/sec 12/02/21; 3.15 ft/sec 12/31/21 Goal status: PARTIALLY MET 12/31/21  Patient to demonstrate 5xSTS test in <15 sec and with good eccentric control in order to demonstrate improved functional strength. Baseline: poor eccentric control; 9 sec with limited eccentric control 12/02/21; 10/11 sec 12/31/21 Goal status: MET 12/31/21   ASSESSMENT:  CLINICAL IMPRESSION: Patient arrived to session without new complaints. Patient scored 24/30 on FGA, indicating a decreased risk of falls and improved from last  assessment. Gait speed today was 3.15 ft/sec, improved considerably. Patient was able to perform 5xSTS with good eccentric control and with good speed today. At this time, patient has made good progress towards goals, and is ready for D/C with transition to continued personal training and HEP.      OBJECTIVE IMPAIRMENTS Abnormal gait, decreased activity tolerance, decreased balance, difficulty walking, decreased strength, increased muscle spasms, and impaired tone.   ACTIVITY LIMITATIONS carrying, lifting, squatting, stairs, and transfers  PARTICIPATION LIMITATIONS: meal prep, cleaning, laundry, shopping, community activity, yard work, and church  PERSONAL FACTORS Age, Fitness, Past/current experiences, Time since onset of injury/illness/exacerbation, and 3+ comorbidities: asthma, MS, R patellar dislocation  are also affecting patient's functional outcome.   REHAB POTENTIAL: Good  CLINICAL DECISION MAKING: Evolving/moderate complexity  EVALUATION COMPLEXITY: Moderate  PLAN: PT FREQUENCY: 2x/week  PT DURATION: 4 weeks  PLANNED INTERVENTIONS: Therapeutic exercises, Therapeutic activity, Neuromuscular re-education, Balance training, Gait training, Patient/Family education, Self Care, Joint mobilization, Stair training, Vestibular training, Canalith repositioning, Aquatic Therapy, Dry Needling, Electrical stimulation, Cryotherapy, Moist heat, Taping, Manual therapy, and Re-evaluation  PLAN FOR NEXT SESSION: DC at this time     PHYSICAL THERAPY DISCHARGE SUMMARY  Visits from Start of Care: 16  Current functional level related to goals / functional outcomes: See above clinical impression   Remaining deficits: Decreased gait speed   Education / Equipment: HEP  Plan: Patient agrees to discharge.  Patient goals were partially met. Patient is being discharged due to meeting the stated rehab goals.       Janene Harvey, PT, DPT 12/31/21 11:07 AM  [facility] [address] [place], [place], [place] [address] Phone # [phone] Fax # [phone]

## 2021-12-31 ENCOUNTER — Ambulatory Visit: Payer: BC Managed Care – PPO | Admitting: Physical Therapy

## 2021-12-31 ENCOUNTER — Encounter: Payer: Self-pay | Admitting: Physical Therapy

## 2021-12-31 DIAGNOSIS — M6281 Muscle weakness (generalized): Secondary | ICD-10-CM | POA: Diagnosis not present

## 2021-12-31 DIAGNOSIS — R2681 Unsteadiness on feet: Secondary | ICD-10-CM | POA: Diagnosis not present

## 2021-12-31 DIAGNOSIS — R2689 Other abnormalities of gait and mobility: Secondary | ICD-10-CM | POA: Diagnosis not present

## 2022-01-07 DIAGNOSIS — M170 Bilateral primary osteoarthritis of knee: Secondary | ICD-10-CM | POA: Diagnosis not present

## 2022-02-18 DIAGNOSIS — Z08 Encounter for follow-up examination after completed treatment for malignant neoplasm: Secondary | ICD-10-CM | POA: Diagnosis not present

## 2022-02-18 DIAGNOSIS — X32XXXD Exposure to sunlight, subsequent encounter: Secondary | ICD-10-CM | POA: Diagnosis not present

## 2022-02-18 DIAGNOSIS — Z86006 Personal history of melanoma in-situ: Secondary | ICD-10-CM | POA: Diagnosis not present

## 2022-02-18 DIAGNOSIS — L821 Other seborrheic keratosis: Secondary | ICD-10-CM | POA: Diagnosis not present

## 2022-02-18 DIAGNOSIS — Z1283 Encounter for screening for malignant neoplasm of skin: Secondary | ICD-10-CM | POA: Diagnosis not present

## 2022-02-18 DIAGNOSIS — L570 Actinic keratosis: Secondary | ICD-10-CM | POA: Diagnosis not present

## 2022-02-18 DIAGNOSIS — L820 Inflamed seborrheic keratosis: Secondary | ICD-10-CM | POA: Diagnosis not present

## 2022-02-24 ENCOUNTER — Ambulatory Visit: Payer: BC Managed Care – PPO | Admitting: Sports Medicine

## 2022-03-19 ENCOUNTER — Other Ambulatory Visit: Payer: Self-pay | Admitting: Obstetrics and Gynecology

## 2022-03-19 DIAGNOSIS — Z1231 Encounter for screening mammogram for malignant neoplasm of breast: Secondary | ICD-10-CM

## 2022-03-27 DIAGNOSIS — N318 Other neuromuscular dysfunction of bladder: Secondary | ICD-10-CM | POA: Diagnosis not present

## 2022-03-27 DIAGNOSIS — R350 Frequency of micturition: Secondary | ICD-10-CM | POA: Diagnosis not present

## 2022-03-27 DIAGNOSIS — R3589 Other polyuria: Secondary | ICD-10-CM | POA: Diagnosis not present

## 2022-03-27 DIAGNOSIS — M170 Bilateral primary osteoarthritis of knee: Secondary | ICD-10-CM | POA: Diagnosis not present

## 2022-03-27 DIAGNOSIS — N3941 Urge incontinence: Secondary | ICD-10-CM | POA: Diagnosis not present

## 2022-03-27 DIAGNOSIS — N319 Neuromuscular dysfunction of bladder, unspecified: Secondary | ICD-10-CM | POA: Diagnosis not present

## 2022-03-27 DIAGNOSIS — R338 Other retention of urine: Secondary | ICD-10-CM | POA: Diagnosis not present

## 2022-04-07 ENCOUNTER — Ambulatory Visit: Payer: BC Managed Care – PPO | Admitting: Sports Medicine

## 2022-04-07 VITALS — BP 122/70 | Ht 62.0 in | Wt 105.0 lb

## 2022-04-07 DIAGNOSIS — M79671 Pain in right foot: Secondary | ICD-10-CM

## 2022-04-07 DIAGNOSIS — M79672 Pain in left foot: Secondary | ICD-10-CM

## 2022-04-07 NOTE — Assessment & Plan Note (Signed)
Today we removed the medial wedge of her right orthotic and placed a smaller arch support on both of her orthotics.  She ambulated in these which did still give her some correction from the rear foot valgus.  She reports these did not cause her any pain in her arch like previous and she fell like her foot was quite straight.  Recommend wearing these and all of her shoes.  She may also want to wear her knee compression sleeve more frequently throughout the day to give her right longitudinal foot arch a break from the stress.  She verbalized understanding.  She can follow-up as needed, call for quick orthotic visit as needed.

## 2022-04-07 NOTE — Progress Notes (Signed)
Established Patient Office Visit  Subjective   Patient ID: Tara Ross, female    DOB: 1961-01-28  Age: 62 y.o. MRN: [id number]  Right foot pain.   Ms. Mcelvain presents today with chief complaint of right foot pain mainly located over her arch at the end of the day.  Back in August we modified her orthotics that she was having some posterior tibialis strain, she was given a medial wedge in her right shoe.  She has been doing her tibialis rehab at home regularly and has had resolution of medial ankle pain she had been experiencing before.  She feels like her right foot is being forced into external rotation and she is fighting to keep her foot straight.  She transitions orthotics into all of her shoes at home.  Of note she recently had an injection by Dr. Sallye Lat for some of her instability.  She bumped her knee the other day during her workout so she has it wrapped today.   ROS as listed above in HPI  Reviewed and injection was by Dr Griffin Basil   Objective:     BP 122/70   Ht '5\' 2"'$  (1.575 m)   Wt 105 lb (47.6 kg)   BMI 19.20 kg/m   Physical Exam Vitals reviewed.  Constitutional:      General: She is not in acute distress.    Appearance: Normal appearance. She is normal weight. She is not ill-appearing, toxic-appearing or diaphoretic.  Pulmonary:     Effort: Pulmonary effort is normal.  Neurological:     Mental Status: She is alert.   Right ankle and foot: She does have some calcaneal valgus right greater than left side.  Tenderness to palpation middle of her arch.  No tenderness to palpation at the origin of the plantar fascia.  No tenderness to palpation of the posterior tibialis tendon.  Full range of motion with plantarflexion and dorsiflexion of the ankle. Her gait does appear corrected while wearing the insoles, decreasing her pronation.  She does have some midline crossover every couple of steps likely secondary to the genu valgus of her knee.     Assessment & Plan:    Problem List Items Addressed This Visit       Other   Foot pain, bilateral - Primary    Today we removed the medial wedge of her right orthotic and placed a smaller arch support on both of her orthotics.  She ambulated in these which did still give her some correction from the rear foot valgus.  She reports these did not cause her any pain in her arch like previous and she fell like her foot was quite straight.  Recommend wearing these and all of her shoes.  She may also want to wear her knee compression sleeve more frequently throughout the day to give her right longitudinal foot arch a break from the stress.  She verbalized understanding.  She can follow-up as needed, call for quick orthotic visit as needed.       Return if symptoms worsen or fail to improve.    Elmore Guise, DO  I observed and examined the patient with the [facility] resident and agree with assessment and plan.  Note reviewed and modified by me. I agree that the foot correction looks good.  She may need to get better control of the genu valgus of the right knee so as not to put more pressure on the right foot during gait.  If a compression sleeve  is not enough she may need to go to a more formal brace. Ila Mcgill, MD

## 2022-04-10 DIAGNOSIS — R509 Fever, unspecified: Secondary | ICD-10-CM | POA: Diagnosis not present

## 2022-04-10 DIAGNOSIS — U071 COVID-19: Secondary | ICD-10-CM | POA: Diagnosis not present

## 2022-04-10 DIAGNOSIS — R059 Cough, unspecified: Secondary | ICD-10-CM | POA: Diagnosis not present

## 2022-04-24 DIAGNOSIS — G35 Multiple sclerosis: Secondary | ICD-10-CM | POA: Diagnosis not present

## 2022-05-12 ENCOUNTER — Ambulatory Visit: Payer: BC Managed Care – PPO

## 2022-06-24 ENCOUNTER — Ambulatory Visit
Admission: RE | Admit: 2022-06-24 | Discharge: 2022-06-24 | Disposition: A | Discharge status: Home / Self Care | Payer: BC Managed Care – PPO | Source: Ambulatory Visit | Attending: Obstetrics and Gynecology | Admitting: Obstetrics and Gynecology

## 2022-06-24 DIAGNOSIS — Z1231 Encounter for screening mammogram for malignant neoplasm of breast: Secondary | ICD-10-CM

## 2022-06-30 DIAGNOSIS — R82998 Other abnormal findings in urine: Secondary | ICD-10-CM | POA: Diagnosis not present

## 2022-06-30 DIAGNOSIS — Z6820 Body mass index (BMI) 20.0-20.9, adult: Secondary | ICD-10-CM | POA: Diagnosis not present

## 2022-06-30 DIAGNOSIS — Z1151 Encounter for screening for human papillomavirus (HPV): Secondary | ICD-10-CM | POA: Diagnosis not present

## 2022-06-30 DIAGNOSIS — Z124 Encounter for screening for malignant neoplasm of cervix: Secondary | ICD-10-CM | POA: Diagnosis not present

## 2022-06-30 DIAGNOSIS — Z01419 Encounter for gynecological examination (general) (routine) without abnormal findings: Secondary | ICD-10-CM | POA: Diagnosis not present

## 2022-07-07 DIAGNOSIS — M170 Bilateral primary osteoarthritis of knee: Secondary | ICD-10-CM | POA: Diagnosis not present

## 2022-07-28 DIAGNOSIS — N390 Urinary tract infection, site not specified: Secondary | ICD-10-CM | POA: Diagnosis not present

## 2022-07-28 DIAGNOSIS — M1712 Unilateral primary osteoarthritis, left knee: Secondary | ICD-10-CM | POA: Diagnosis not present

## 2022-07-28 DIAGNOSIS — M1711 Unilateral primary osteoarthritis, right knee: Secondary | ICD-10-CM | POA: Diagnosis not present

## 2022-08-27 DIAGNOSIS — G35 Multiple sclerosis: Secondary | ICD-10-CM | POA: Diagnosis not present

## 2022-09-28 DIAGNOSIS — R102 Pelvic and perineal pain: Secondary | ICD-10-CM | POA: Diagnosis not present

## 2022-09-28 DIAGNOSIS — N950 Postmenopausal bleeding: Secondary | ICD-10-CM | POA: Diagnosis not present

## 2022-09-28 DIAGNOSIS — R319 Hematuria, unspecified: Secondary | ICD-10-CM | POA: Diagnosis not present

## 2022-10-05 DIAGNOSIS — N950 Postmenopausal bleeding: Secondary | ICD-10-CM | POA: Diagnosis not present

## 2022-10-06 ENCOUNTER — Other Ambulatory Visit: Payer: Self-pay | Admitting: Obstetrics and Gynecology

## 2022-10-06 DIAGNOSIS — R1032 Left lower quadrant pain: Secondary | ICD-10-CM

## 2022-10-12 DIAGNOSIS — G35 Multiple sclerosis: Secondary | ICD-10-CM | POA: Diagnosis not present

## 2022-10-12 DIAGNOSIS — Z13220 Encounter for screening for lipoid disorders: Secondary | ICD-10-CM | POA: Diagnosis not present

## 2022-10-12 DIAGNOSIS — M810 Age-related osteoporosis without current pathological fracture: Secondary | ICD-10-CM | POA: Diagnosis not present

## 2022-10-12 DIAGNOSIS — R1032 Left lower quadrant pain: Secondary | ICD-10-CM | POA: Diagnosis not present

## 2022-10-12 DIAGNOSIS — Z0000 Encounter for general adult medical examination without abnormal findings: Secondary | ICD-10-CM | POA: Diagnosis not present

## 2022-10-12 DIAGNOSIS — L659 Nonscarring hair loss, unspecified: Secondary | ICD-10-CM | POA: Diagnosis not present

## 2022-10-13 ENCOUNTER — Encounter: Payer: Self-pay | Admitting: Obstetrics and Gynecology

## 2022-10-14 ENCOUNTER — Ambulatory Visit
Admission: RE | Admit: 2022-10-14 | Discharge: 2022-10-14 | Disposition: A | Discharge status: Home / Self Care | Payer: BC Managed Care – PPO | Source: Ambulatory Visit | Attending: Obstetrics and Gynecology | Admitting: Obstetrics and Gynecology

## 2022-10-14 DIAGNOSIS — R1032 Left lower quadrant pain: Secondary | ICD-10-CM

## 2022-10-14 DIAGNOSIS — I700 Atherosclerosis of aorta: Secondary | ICD-10-CM | POA: Diagnosis not present

## 2022-10-14 MED ORDER — IOPAMIDOL (ISOVUE-300) INJECTION 61%
100.0000 mL | Freq: Once | INTRAVENOUS | Status: AC | PRN
  Administered 2022-10-14: 100 mL via INTRAVENOUS

## 2022-10-20 DIAGNOSIS — L570 Actinic keratosis: Secondary | ICD-10-CM | POA: Diagnosis not present

## 2022-10-20 DIAGNOSIS — X32XXXD Exposure to sunlight, subsequent encounter: Secondary | ICD-10-CM | POA: Diagnosis not present

## 2022-10-20 DIAGNOSIS — C44311 Basal cell carcinoma of skin of nose: Secondary | ICD-10-CM | POA: Diagnosis not present

## 2022-10-21 ENCOUNTER — Other Ambulatory Visit: Payer: Self-pay | Admitting: Obstetrics and Gynecology

## 2022-10-21 DIAGNOSIS — Z139 Encounter for screening, unspecified: Secondary | ICD-10-CM

## 2022-10-29 ENCOUNTER — Other Ambulatory Visit: Payer: BC Managed Care – PPO

## 2022-11-02 ENCOUNTER — Ambulatory Visit
Admission: RE | Admit: 2022-11-02 | Discharge: 2022-11-02 | Disposition: A | Discharge status: Home / Self Care | Payer: No Typology Code available for payment source | Source: Ambulatory Visit | Attending: Obstetrics and Gynecology | Admitting: Obstetrics and Gynecology

## 2022-11-02 DIAGNOSIS — Z136 Encounter for screening for cardiovascular disorders: Secondary | ICD-10-CM | POA: Diagnosis not present

## 2022-11-02 DIAGNOSIS — Z139 Encounter for screening, unspecified: Secondary | ICD-10-CM

## 2022-11-02 DIAGNOSIS — R931 Abnormal findings on diagnostic imaging of heart and coronary circulation: Secondary | ICD-10-CM | POA: Diagnosis not present

## 2022-11-02 DIAGNOSIS — I700 Atherosclerosis of aorta: Secondary | ICD-10-CM | POA: Diagnosis not present

## 2022-11-06 ENCOUNTER — Ambulatory Visit (INDEPENDENT_AMBULATORY_CARE_PROVIDER_SITE_OTHER): Payer: BC Managed Care – PPO

## 2022-11-06 ENCOUNTER — Ambulatory Visit (HOSPITAL_BASED_OUTPATIENT_CLINIC_OR_DEPARTMENT_OTHER): Payer: BC Managed Care – PPO | Admitting: Orthopaedic Surgery

## 2022-11-06 ENCOUNTER — Ambulatory Visit (HOSPITAL_BASED_OUTPATIENT_CLINIC_OR_DEPARTMENT_OTHER): Payer: Self-pay | Admitting: Orthopaedic Surgery

## 2022-11-06 DIAGNOSIS — G8929 Other chronic pain: Secondary | ICD-10-CM

## 2022-11-06 DIAGNOSIS — M25561 Pain in right knee: Secondary | ICD-10-CM

## 2022-11-06 DIAGNOSIS — M1712 Unilateral primary osteoarthritis, left knee: Secondary | ICD-10-CM | POA: Diagnosis not present

## 2022-11-06 DIAGNOSIS — M1711 Unilateral primary osteoarthritis, right knee: Secondary | ICD-10-CM | POA: Diagnosis not present

## 2022-11-06 DIAGNOSIS — M25461 Effusion, right knee: Secondary | ICD-10-CM | POA: Diagnosis not present

## 2022-11-06 DIAGNOSIS — M25562 Pain in left knee: Secondary | ICD-10-CM | POA: Diagnosis not present

## 2022-11-06 DIAGNOSIS — M25462 Effusion, left knee: Secondary | ICD-10-CM | POA: Diagnosis not present

## 2022-11-06 NOTE — Progress Notes (Signed)
Chief Complaint: Bilateral patellar instability     History of Present Illness:    Tara Ross is a 62 y.o. female presents with bilateral patella maltracking.  This has been occurring since she has been young.  She does have a history of multiple dislocations of both patellas.  She states that the right does tend to track worse than left.  She has been seeing Dr. Austin Miles team who has been providing Zilretta injections.  She is here today seeking further treatment.  She does not have significant pain rather is predominantly having issues with maltracking prickly on the right.  She does have a history of flatfoot deformity on the right as well.    Surgical History:   None  PMH/PSH/Family History/Social History/Meds/Allergies:    Past Medical History:  Diagnosis Date   Asthma    Basal cell carcinoma of scalp and skin of face    Cataract    Neuromuscular disorder (HCC)    Past Surgical History:  Procedure Laterality Date   AUGMENTATION MAMMAPLASTY Bilateral    Social History   Socioeconomic History   Marital status: Married    Spouse name: Not on file   Number of children: Not on file   Years of education: Not on file   Highest education level: Not on file  Occupational History   Not on file  Tobacco Use   Smoking status: Never   Smokeless tobacco: Never  Substance and Sexual Activity   Alcohol use: Not on file   Drug use: Not on file   Sexual activity: Not on file  Other Topics Concern   Not on file  Social History Narrative   Not on file   Social Determinants of Health   Financial Resource Strain: Not on file  Food Insecurity: Not on file  Transportation Needs: Not on file  Physical Activity: Not on file  Stress: Not on file  Social Connections: Not on file   No family history on file. No Known Allergies Current Outpatient Medications  Medication Sig Dispense Refill   antiseptic oral rinse (BIOTENE) LIQD 15 mLs by  Mouth Rinse route as needed for dry mouth. (Patient not taking: Reported on 10/24/2021)     Ascorbic Acid (VITAMIN C) 1000 MG tablet Take by mouth.     Calcium Carb-Cholecalciferol (CALCIUM 1000 + D PO) 1 tablet with meals     Cholecalciferol (VITAMIN D) 50 MCG (2000 UT) CAPS 1 capsule     DALFAMPRIDINE ER PO Take 10 mg by mouth 2 (two) times daily.     ferrous sulfate 325 (65 FE) MG tablet Take 325 mg by mouth daily with breakfast.     Fingolimod HCl 0.5 MG CAPS See admin instructions.     Magnesium 500 MG CAPS 1 tablet with a meal     Multiple Vitamin (MULTIVITAMIN ADULT PO) Take by mouth.     No current facility-administered medications for this visit.   No results found.  Review of Systems:   A ROS was performed including pertinent positives and negatives as documented in the HPI.  Physical Exam :   Constitutional: NAD and appears stated age Neurological: Alert and oriented Psych: Appropriate affect and cooperative There were no vitals taken for this visit.   Comprehensive Musculoskeletal Exam:    Bilateral patellofemoral lateral patellar tracking.  She has 4 quadrants of lateral patellar motion on the right compared to 3 on the left.  No joint line tenderness.  No crepitus aside from the patella on the right.  Imaging:   Xray (4 view right knee, 4 view left knee): Advanced patellofemoral osteoarthritis bilaterally but worse on the right    I personally reviewed and interpreted the radiographs.   Assessment:   62 y.o. female with right worse than left patellofemoral osteoarthritis in the setting of recurrent patellar instability bilaterally.  Overall I did discuss that her situation is somewhat tough given the fact that she does not have significant pain.  I did encourage bracing to improve patellar tracking.  She would also like a referral for PRP particularly of the right knee.  We did discuss the importance of addressing the flatfoot.  She would like to avoid any type of  surgical intervention at this time.  Ultimately I do believe she would be a candidate for knee arthroplasty although she would like to hold this for the time being.  Will plan for referral to Dr. Shon Baton for PRP of the right knee.  I would also like to enroll her in aquatic therapy as well.  Plan :    -Plan for referral to Dr. Shon Baton for PRP and aquatic therapy for both knees     I personally saw and evaluated the patient, and participated in the management and treatment plan.  Huel Cote, MD Attending Physician, Orthopedic Surgery  This document was dictated using Dragon voice recognition software. A reasonable attempt at proof reading has been made to minimize errors.

## 2022-11-10 DIAGNOSIS — K5901 Slow transit constipation: Secondary | ICD-10-CM | POA: Diagnosis not present

## 2022-11-10 DIAGNOSIS — R1032 Left lower quadrant pain: Secondary | ICD-10-CM | POA: Diagnosis not present

## 2022-11-10 DIAGNOSIS — G35 Multiple sclerosis: Secondary | ICD-10-CM | POA: Diagnosis not present

## 2022-11-10 DIAGNOSIS — I700 Atherosclerosis of aorta: Secondary | ICD-10-CM | POA: Diagnosis not present

## 2022-11-13 DIAGNOSIS — M1711 Unilateral primary osteoarthritis, right knee: Secondary | ICD-10-CM | POA: Diagnosis not present

## 2022-11-16 ENCOUNTER — Other Ambulatory Visit: Payer: Self-pay | Admitting: Obstetrics and Gynecology

## 2022-11-16 DIAGNOSIS — T8549XA Other mechanical complication of breast prosthesis and implant, initial encounter: Secondary | ICD-10-CM

## 2022-11-16 DIAGNOSIS — T8543XA Leakage of breast prosthesis and implant, initial encounter: Secondary | ICD-10-CM

## 2022-11-17 DIAGNOSIS — L820 Inflamed seborrheic keratosis: Secondary | ICD-10-CM | POA: Diagnosis not present

## 2022-11-17 DIAGNOSIS — Z85828 Personal history of other malignant neoplasm of skin: Secondary | ICD-10-CM | POA: Diagnosis not present

## 2022-11-17 DIAGNOSIS — Z08 Encounter for follow-up examination after completed treatment for malignant neoplasm: Secondary | ICD-10-CM | POA: Diagnosis not present

## 2022-11-18 ENCOUNTER — Ambulatory Visit: Payer: BC Managed Care – PPO | Admitting: Sports Medicine

## 2022-11-25 ENCOUNTER — Ambulatory Visit: Payer: BC Managed Care – PPO | Admitting: Sports Medicine

## 2022-11-25 VITALS — BP 122/78 | Ht 62.0 in | Wt 105.0 lb

## 2022-11-25 DIAGNOSIS — R269 Unspecified abnormalities of gait and mobility: Secondary | ICD-10-CM | POA: Diagnosis not present

## 2022-11-25 NOTE — Assessment & Plan Note (Signed)
I placed a wedged correction with the medial buildup on her right orthotic For the left orthotic I put a neutral lift at the heel At completion of this she was able to correct about half of the foot turn out She felt more comfortable with this  We may need to make a new pair of custom orthotics with greater support to block her pronation if this correction does not do enough

## 2022-11-25 NOTE — Progress Notes (Signed)
Chief complaint; right foot turning out and uncomfortable  Patient has a history of multiple subluxations of her patella She receives periodic injections from Dr. Everardo Pacific because of knee pain She has a chronic valgus thrust when walking with the right knee  We placed her in custom orthotics sometime ago and they have given her more support and made things more comfortable Recently however she feels like the right foot wants to turn out and she is not getting as much support out of the orthotic as before  Physical exam Pleasant thin white female in no acute distress BP 122/78   Ht 5\' 2"  (1.575 m)   Wt 105 lb (47.6 kg)   BMI 19.20 kg/m   Standing alignment shows that there is a 10 degree turn out of the right foot  there is subtalar joint subluxation leading to calcaneal valgus Some collapse of the longitudinal arch And leg lengths were equal  Walking gait shows a rapid valgus thrust to the right knee which increases the outward rotation of the right foot and the degree of pronation Her current pair of orthotics do not have enough correction to block this change

## 2022-11-30 ENCOUNTER — Ambulatory Visit
Admission: RE | Admit: 2022-11-30 | Discharge: 2022-11-30 | Disposition: A | Discharge status: Home / Self Care | Payer: BC Managed Care – PPO | Source: Ambulatory Visit | Attending: Obstetrics and Gynecology | Admitting: Obstetrics and Gynecology

## 2022-11-30 ENCOUNTER — Other Ambulatory Visit: Payer: Self-pay | Admitting: Obstetrics and Gynecology

## 2022-11-30 DIAGNOSIS — T8549XA Other mechanical complication of breast prosthesis and implant, initial encounter: Secondary | ICD-10-CM | POA: Diagnosis not present

## 2022-11-30 DIAGNOSIS — T8543XA Leakage of breast prosthesis and implant, initial encounter: Secondary | ICD-10-CM

## 2022-12-08 ENCOUNTER — Ambulatory Visit (INDEPENDENT_AMBULATORY_CARE_PROVIDER_SITE_OTHER): Payer: BC Managed Care – PPO | Admitting: Sports Medicine

## 2022-12-08 VITALS — BP 106/68 | Ht 62.0 in | Wt 105.0 lb

## 2022-12-08 DIAGNOSIS — R269 Unspecified abnormalities of gait and mobility: Secondary | ICD-10-CM

## 2022-12-08 DIAGNOSIS — M1711 Unilateral primary osteoarthritis, right knee: Secondary | ICD-10-CM | POA: Diagnosis not present

## 2022-12-08 NOTE — Assessment & Plan Note (Signed)
Today instead of a larger wedge I placed a heel wedge only on her right We continued her in a dress orthotic with a thin base She has a scaphoid pad cut out for both orthotics  With the wedge correction her gait went from 10 degrees of foot turn out to about 3 to 4 degrees She showed less pronation She showed less dynamic genu valgus  We will try this correction for 1 month and then perhaps make her new orthotics if she would like In addition she is can work hard on the hip abduction exercises and we will recheck her in 2 months to see if her strength has improved

## 2022-12-08 NOTE — Progress Notes (Signed)
Chief complaint right knee pain and gait abnormality  We tried to make a correction on the patient's orthotics to the left and wedge her right side to correct the degree of dynamic genu valgus she was getting with walking She has chronic degenerative arthritis of the right knee but this is primarily in the patellofemoral compartment because she still has good flexion and extension. However she has lost enough articular cartilage that with walking she gets dynamic movement and medial pain frequently  She had changed a pair of her dress orthotics with some scaphoid padding and these felt more comfortable She comes for evaluation to see if she needs new orthotics or different approach  Physical exam Thin pleasant white female in no acute distress BP 106/68   Ht 5\' 2"  (1.575 m)   Wt 105 lb (47.6 kg)   BMI 19.20 kg/m   She has good flexion and extension of the right knee However with walking she gets dynamic genu valgus She gets 10 degrees of turnout of the right foot starting with some subtalar subluxation at the right heel Lift has some minimal motion but is in good alignment Strength testing reveals significant weakness of hip abductors on both sides Her hip adductors are strong and her pulling both knees toward the midline giving her some dynamic shift even on the left

## 2022-12-15 DIAGNOSIS — M1711 Unilateral primary osteoarthritis, right knee: Secondary | ICD-10-CM | POA: Diagnosis not present

## 2022-12-22 DIAGNOSIS — M1711 Unilateral primary osteoarthritis, right knee: Secondary | ICD-10-CM | POA: Diagnosis not present

## 2022-12-29 DIAGNOSIS — L570 Actinic keratosis: Secondary | ICD-10-CM | POA: Diagnosis not present

## 2022-12-29 DIAGNOSIS — X32XXXD Exposure to sunlight, subsequent encounter: Secondary | ICD-10-CM | POA: Diagnosis not present

## 2022-12-29 DIAGNOSIS — L820 Inflamed seborrheic keratosis: Secondary | ICD-10-CM | POA: Diagnosis not present

## 2023-01-04 NOTE — Telephone Encounter (Signed)
error 

## 2023-01-25 DIAGNOSIS — Z08 Encounter for follow-up examination after completed treatment for malignant neoplasm: Secondary | ICD-10-CM | POA: Diagnosis not present

## 2023-01-25 DIAGNOSIS — D225 Melanocytic nevi of trunk: Secondary | ICD-10-CM | POA: Diagnosis not present

## 2023-01-25 DIAGNOSIS — L570 Actinic keratosis: Secondary | ICD-10-CM | POA: Diagnosis not present

## 2023-01-25 DIAGNOSIS — X32XXXD Exposure to sunlight, subsequent encounter: Secondary | ICD-10-CM | POA: Diagnosis not present

## 2023-01-25 DIAGNOSIS — Z86006 Personal history of melanoma in-situ: Secondary | ICD-10-CM | POA: Diagnosis not present

## 2023-01-25 DIAGNOSIS — Z1283 Encounter for screening for malignant neoplasm of skin: Secondary | ICD-10-CM | POA: Diagnosis not present

## 2023-01-28 ENCOUNTER — Encounter: Payer: Self-pay | Admitting: Family Medicine

## 2023-01-28 ENCOUNTER — Ambulatory Visit: Payer: BC Managed Care – PPO | Admitting: Family Medicine

## 2023-01-28 VITALS — BP 112/78 | Ht 62.0 in | Wt 105.0 lb

## 2023-01-28 DIAGNOSIS — R269 Unspecified abnormalities of gait and mobility: Secondary | ICD-10-CM

## 2023-01-28 DIAGNOSIS — M25361 Other instability, right knee: Secondary | ICD-10-CM

## 2023-01-28 DIAGNOSIS — M1711 Unilateral primary osteoarthritis, right knee: Secondary | ICD-10-CM

## 2023-01-28 NOTE — Assessment & Plan Note (Signed)
Hx of hypermobility, patellar instability and osteoarthritis managed with injections, most recently gel injections last month at [facility]. We updated her orthotics as above to aid in moderate correction of her genu valgus and mechanical component of the knee pain.

## 2023-01-28 NOTE — Assessment & Plan Note (Signed)
Persistent genu valgus of right knee with right foot pronation. We made her new dress shoe orthotics with additional longitudinal support today.   Patient was fitted for a: dress shoe, cushioned, semi-rigid orthotic. The orthotic was heated and afterward the patient stood on the orthotic blank positioned on the orthotic stand. The patient was positioned on the left in a subtalar neutral position and 10 degrees of ankle dorsiflexion in a weight bearing stance on the heated orthotic blank and for the right a similar position but with her right knee in slight varus bend.After completion of molding, a stable base was applied to the orthotic blank. The blank was ground to a stable position for weight bearing. Size: 7, dress orthotic Base: Blue med density EVA Posting: None Additional orthotic padding: Pediatric scaphoid pads bilaterally  She ambulated the halls with these and noted comfort. Dynamic gait analysis post orthotics showed near 80% improvement in the foot turn out & pronation with about 60% improvement in the right knee dynamic valgus.

## 2023-01-28 NOTE — Progress Notes (Signed)
PCP: Sigmund Hazel, MD  SUBJECTIVE:   HPI:  Patient is a 62 y.o. female here with chief complaint of chronic right knee pain and known gait abnormality.   She has had multiple pairs of dress orthotics made for her over the years and presents today requesting a new set. She has hypermobility disorder and known right knee patellofemoral OA and is trying to push off knee replacement as long as possible. She recently completed gel injection series at [facility] last month for the knee.  Back in September she had two sets of modifications to her current pair of dress orthotics to try to provide her some improvement in knee pain and aid in her dynamic genu valgus.  She states that the medial heel wedge under her right heel was causing her pain both in her knee and the lateral aspect of her right hip so she transferred this up to the longitudinal arch over a modified 3/16th in heel lift (functioning as a scaphoid pad) which she notes was helpful for her foot, knee and hip.  She is looking for a similar type correction and a new set of orthotics today.  ROS:     See HPI  PERTINENT  PMH / PSH FH / / SH:  Past Medical, Surgical, Social, and Family History Reviewed & Updated in the EMR.  Pertinent findings include:  Hypermobility disorder Patellofemoral osteoarthritis on the right Dynamic genu valgus, right greater than left  No Known Allergies  OBJECTIVE:  BP 112/78   Ht 5\' 2"  (1.575 m)   Wt 105 lb (47.6 kg)   BMI 19.20 kg/m   PHYSICAL EXAM:  GEN: Alert and Oriented, NAD, comfortable in exam room RESP: Unlabored respirations, symmetric chest rise PSY: normal mood, congruent affect   MSK EXAM: No gross deformity, swelling, ecchymoses.  Standing alignment shows moderate hyperpronation of the right foot with calcaneus valgus, mild on the left.  She has mild loss of longitudinal arch bilaterally. Full range of motion Dynamic evaluation with walking gait she has a valgus deformity of the  right knee which increases the pronation of the right foot.  Assessment & Plan Abnormality of gait Persistent genu valgus of right knee with right foot pronation. We made her new dress shoe orthotics with additional longitudinal support today.   Patient was fitted for a: dress shoe, cushioned, semi-rigid orthotic. The orthotic was heated and afterward the patient stood on the orthotic blank positioned on the orthotic stand. The patient was positioned on the left in a subtalar neutral position and 10 degrees of ankle dorsiflexion in a weight bearing stance on the heated orthotic blank and for the right a similar position but with her right knee in slight varus bend.After completion of molding, a stable base was applied to the orthotic blank. The blank was ground to a stable position for weight bearing. Size: 7, dress orthotic Base: Blue med density EVA Posting: None Additional orthotic padding: Pediatric scaphoid pads bilaterally  She ambulated the halls with these and noted comfort. Dynamic gait analysis post orthotics showed near 80% improvement in the foot turn out & pronation with about 60% improvement in the right knee dynamic valgus. Patellar instability of right knee Hx of hypermobility, patellar instability and osteoarthritis managed with injections, most recently gel injections last month at [facility]. We updated her orthotics as above to aid in moderate correction of her genu valgus and mechanical component of the knee pain. Patellofemoral arthritis of right knee Hx of hypermobility, patellar  instability and osteoarthritis managed with injections, most recently gel injections last month at [facility]. We updated her orthotics as above to aid in moderate correction of her genu valgus and mechanical component of the knee pain.   Glean Salen, MD PGY-4, Sports Medicine Fellow [facility]  Addendum:  Patient seen and examined with the fellow. History,  exam, plan of care were precepted with me.  I was present for orthotic preparation.  Agree with findings and plan as per fellow note above. Darene Lamer, DO, CAQSM

## 2023-02-10 NOTE — Telephone Encounter (Signed)
Telephone call  

## 2023-02-16 ENCOUNTER — Ambulatory Visit: Payer: BC Managed Care – PPO | Admitting: Sports Medicine

## 2023-02-23 ENCOUNTER — Ambulatory Visit: Payer: BC Managed Care – PPO | Admitting: Sports Medicine

## 2023-02-25 ENCOUNTER — Ambulatory Visit: Payer: BC Managed Care – PPO | Admitting: Sports Medicine

## 2023-02-25 ENCOUNTER — Ambulatory Visit: Payer: BC Managed Care – PPO | Admitting: Family Medicine

## 2023-03-23 ENCOUNTER — Institutional Professional Consult (permissible substitution): Payer: BC Managed Care – PPO | Admitting: Plastic Surgery

## 2023-03-30 DIAGNOSIS — N3941 Urge incontinence: Secondary | ICD-10-CM | POA: Diagnosis not present

## 2023-03-30 DIAGNOSIS — N319 Neuromuscular dysfunction of bladder, unspecified: Secondary | ICD-10-CM | POA: Diagnosis not present

## 2023-03-30 DIAGNOSIS — R339 Retention of urine, unspecified: Secondary | ICD-10-CM | POA: Diagnosis not present

## 2023-03-31 DIAGNOSIS — I700 Atherosclerosis of aorta: Secondary | ICD-10-CM | POA: Diagnosis not present

## 2023-04-09 DIAGNOSIS — M1711 Unilateral primary osteoarthritis, right knee: Secondary | ICD-10-CM | POA: Diagnosis not present

## 2023-05-06 DIAGNOSIS — G35 Multiple sclerosis: Secondary | ICD-10-CM | POA: Diagnosis not present

## 2023-05-08 IMAGING — MG DIGITAL SCREENING BREAST BILAT IMPLANT W/ TOMO W/ CAD
9 of 12 series · 9 of 28 positions shown · non-contrast
Comparison: Previous exam(s).

CLINICAL DATA: Screening.

EXAM:
DIGITAL SCREENING BILATERAL MAMMOGRAM WITH IMPLANTS, CAD AND
TOMOSYNTHESIS
TECHNIQUE: Bilateral screening digital craniocaudal and mediolateral oblique
mammograms were obtained. Bilateral screening digital breast
tomosynthesis was performed. The images were evaluated with
computer-aided detection. Standard and/or implant displaced views
were performed.

[L MLO]
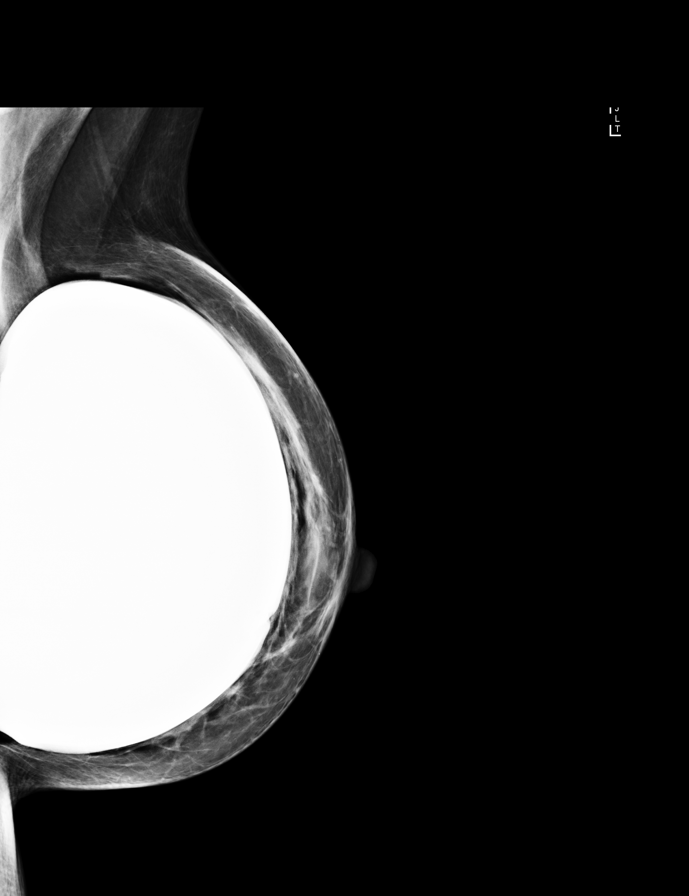

[L CC]
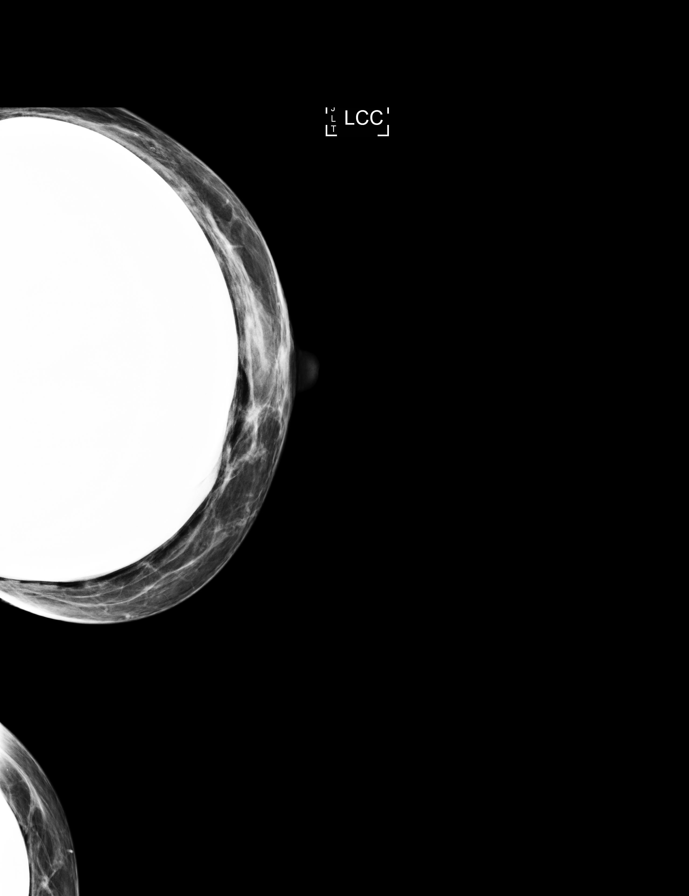

[R CC]
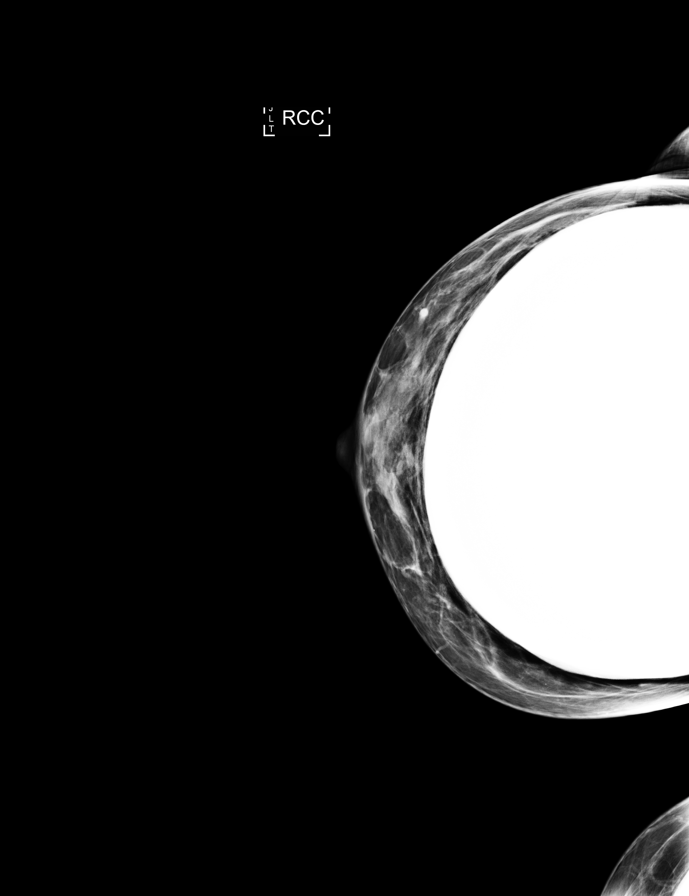

[R MLO]
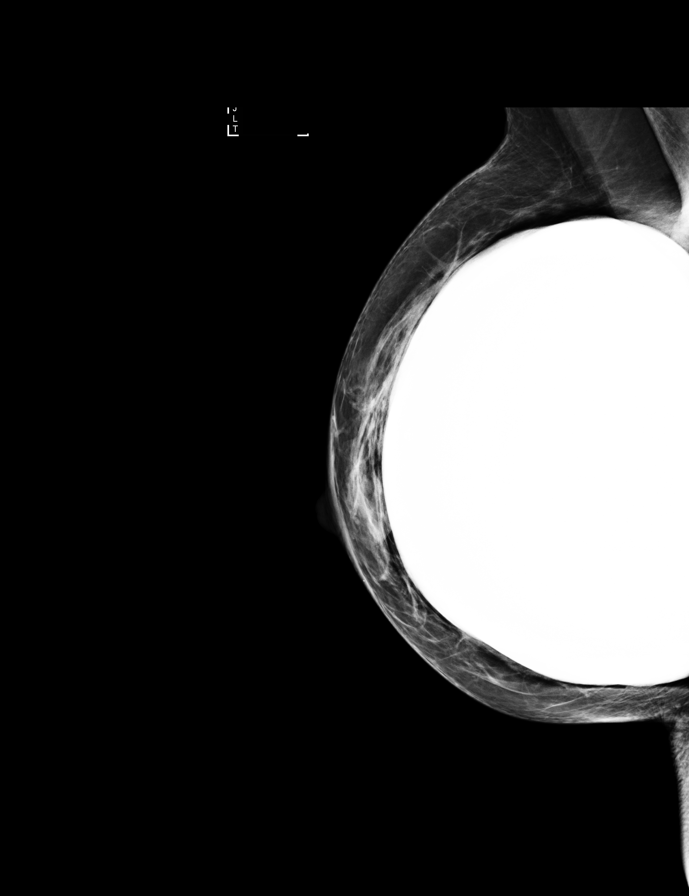

[R MLO synth-2D]
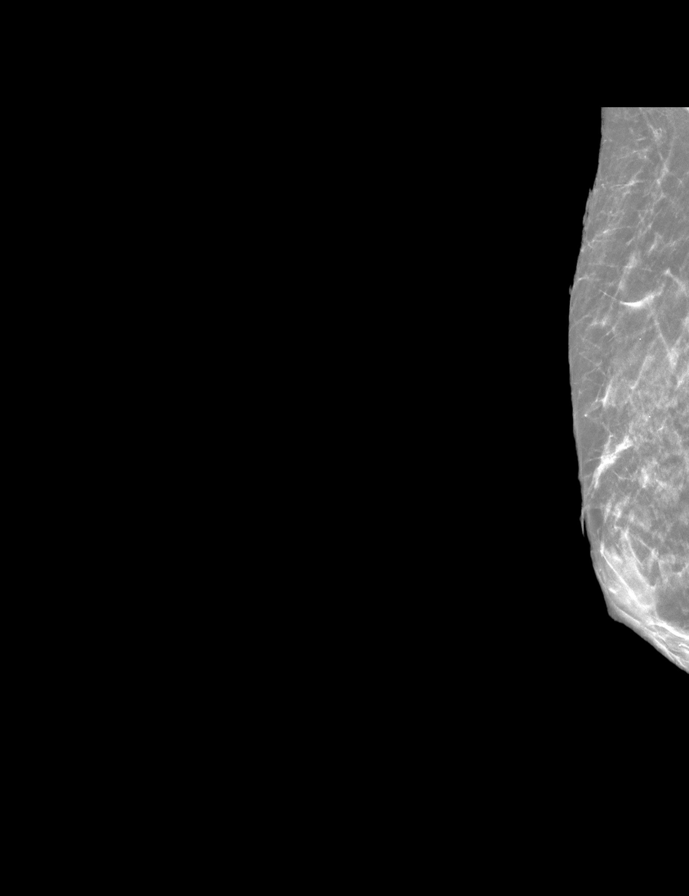

[L CC synth-2D]
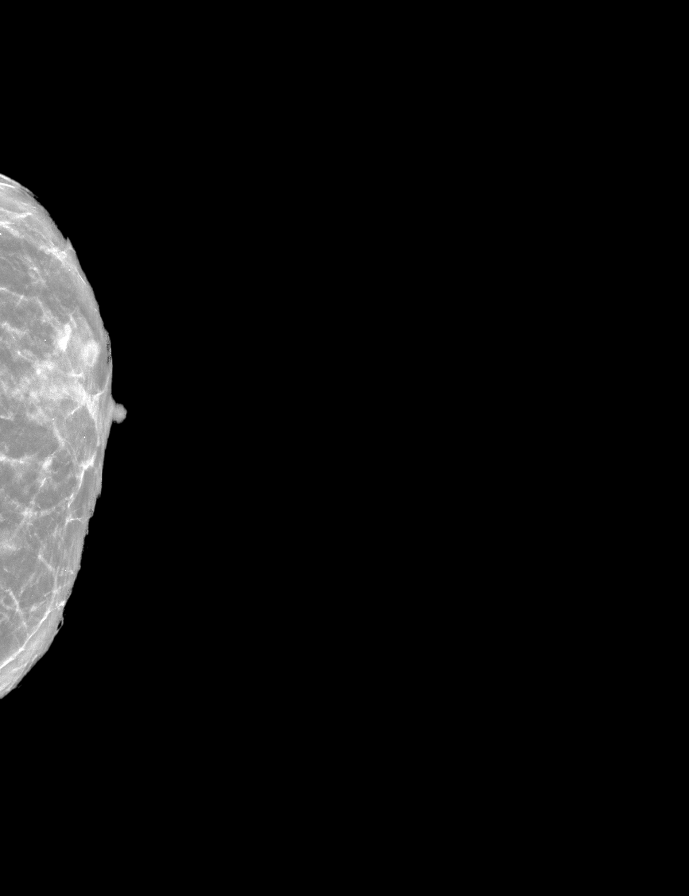

[R CC synth-2D]
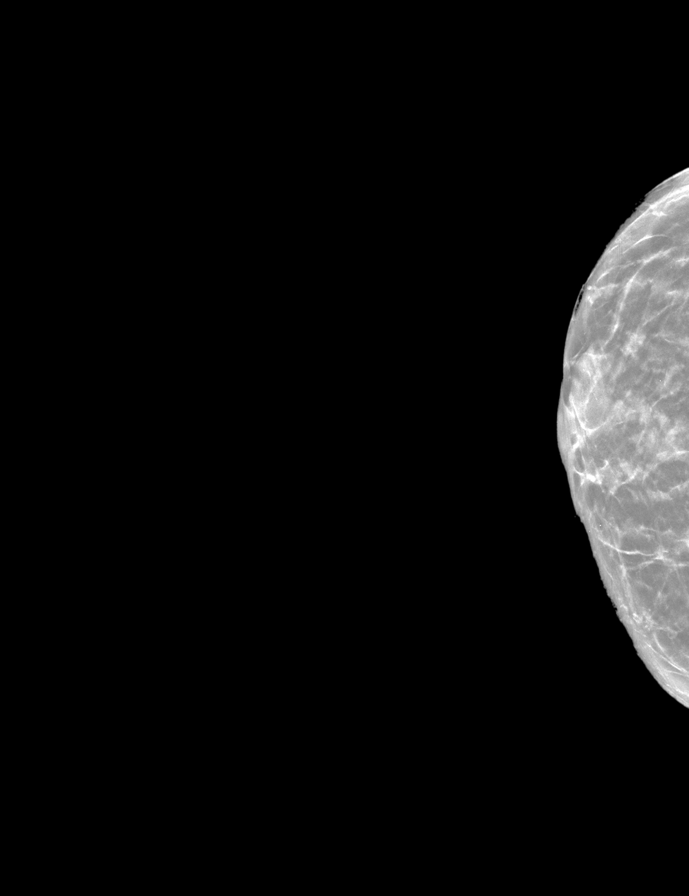

[L MLO synth-2D]
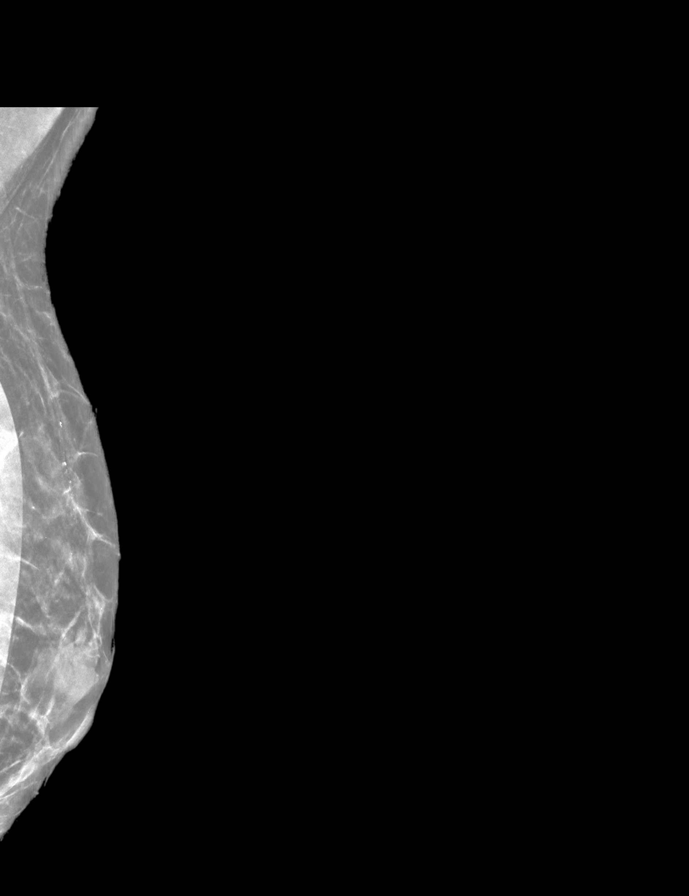

[R CCID BREAST TOMOSYNTHESIS IMAGE tomo · tomo slice 23/44.0]
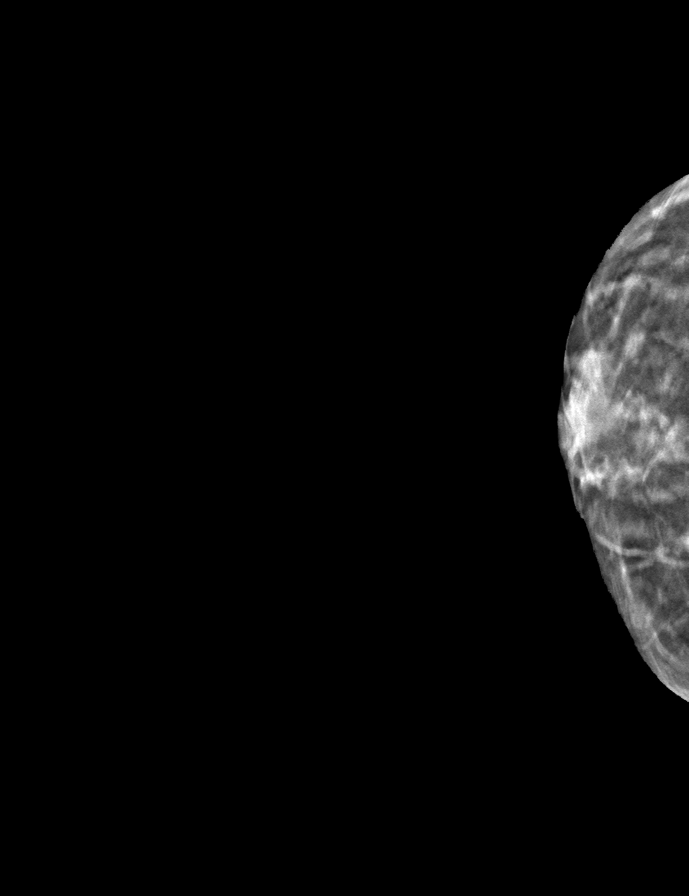

[9 of 28 positions shown; findings below may reference images not displayed]

ACR Breast Density Category b: There are scattered areas of
fibroglandular density.
FINDINGS: The patient has implants. There are no findings suspicious for
malignancy.
IMPRESSION: No mammographic evidence of malignancy. A result letter of this
screening mammogram will be mailed directly to the patient.

RECOMMENDATION:
Screening mammogram in one year. (Code:3J-K-2IG)

BI-RADS CATEGORY  1:  Negative.

## 2023-05-11 ENCOUNTER — Ambulatory Visit (INDEPENDENT_AMBULATORY_CARE_PROVIDER_SITE_OTHER): Payer: BC Managed Care – PPO | Admitting: Plastic Surgery

## 2023-05-11 ENCOUNTER — Encounter: Payer: Self-pay | Admitting: Plastic Surgery

## 2023-05-11 ENCOUNTER — Other Ambulatory Visit: Payer: Self-pay | Admitting: Obstetrics and Gynecology

## 2023-05-11 VITALS — BP 106/71 | HR 78 | Ht 62.0 in | Wt 106.0 lb

## 2023-05-11 DIAGNOSIS — Z1231 Encounter for screening mammogram for malignant neoplasm of breast: Secondary | ICD-10-CM

## 2023-05-11 DIAGNOSIS — T8543XA Leakage of breast prosthesis and implant, initial encounter: Secondary | ICD-10-CM | POA: Insufficient documentation

## 2023-05-11 NOTE — Progress Notes (Signed)
Patient ID: Tara Ross, female    DOB: 03-06-1961, 63 y.o.   MRN: [id number]   Chief Complaint  Patient presents with   Consult         The patient is a 63 year old female here with her husband for evaluation of her breast.  She underwent a recent cardiac CT scan and was found to have ruptures of her implants.  The implants were placed by Dr. Stephens November around 1989.  They are silicone textured implants likely around 300 cc.  She believes that they are over the muscle.  They were placed through a periareolar incision.  Her last mammogram was 2024 and was negative.  She is not a smoker.  She does have MS.  She has been seen by her neurologist recently.  She has a little bit of ptosis of the breasts and the implants seem a little bit large for her size.  We talked about the need for her to be safe considering her age and the MS diagnosis.    Review of Systems  Constitutional: Negative.   HENT: Negative.    Eyes: Negative.   Respiratory: Negative.  Negative for chest tightness and shortness of breath.   Cardiovascular: Negative.   Gastrointestinal: Negative.   Endocrine: Negative.   Genitourinary: Negative.   Musculoskeletal: Negative.     Past Medical History:  Diagnosis Date   Asthma    Basal cell carcinoma of scalp and skin of face    Cataract    Neuromuscular disorder [facility])     Past Surgical History:  Procedure Laterality Date   AUGMENTATION MAMMAPLASTY Bilateral       Current Outpatient Medications:    Ascorbic Acid (VITAMIN C) 1000 MG tablet, Take by mouth., Disp: , Rfl:    Calcium Carb-Cholecalciferol (CALCIUM 1000 + D PO), 1 tablet with meals, Disp: , Rfl:    Cholecalciferol (VITAMIN D) 50 MCG (2000 UT) CAPS, 1 capsule, Disp: , Rfl:    DALFAMPRIDINE ER PO, Take 10 mg by mouth 2 (two) times daily., Disp: , Rfl:    ferrous sulfate 325 (65 FE) MG tablet, Take 325 mg by mouth daily with breakfast., Disp: , Rfl:    Fingolimod HCl 0.5 MG CAPS, See admin  instructions., Disp: , Rfl:    Magnesium 500 MG CAPS, 1 tablet with a meal, Disp: , Rfl:    Multiple Vitamin (MULTIVITAMIN ADULT PO), Take by mouth., Disp: , Rfl:    antiseptic oral rinse (BIOTENE) LIQD, 15 mLs by Mouth Rinse route as needed for dry mouth. (Patient not taking: Reported on 10/24/2021), Disp: , Rfl:    Objective:   Vitals:   05/11/23 1458  BP: 106/71  Pulse: 78  SpO2: 98%    Physical Exam Vitals and nursing note reviewed.  Constitutional:      Appearance: Normal appearance.  HENT:     Head: Normocephalic and atraumatic.  Cardiovascular:     Rate and Rhythm: Normal rate.     Pulses: Normal pulses.  Pulmonary:     Effort: Pulmonary effort is normal.  Abdominal:     Palpations: Abdomen is soft.  Musculoskeletal:        General: No swelling or deformity.  Skin:    General: Skin is warm.     Capillary Refill: Capillary refill takes less than 2 seconds.     Coloration: Skin is not jaundiced.  Neurological:     Mental Status: She is alert and oriented to person, place, and time.  Psychiatric:        Mood and Affect: Mood normal.        Behavior: Behavior normal.        Thought Content: Thought content normal.        Judgment: Judgment normal.     Assessment & Plan:  Ruptured silicone breast implant, initial encounter  Patient wants to have implants put back in but is in agreement to go a little bit smaller for safety and health reasons.  She does want to go with silicone.  She may need a mastopexy.  Pictures were obtained of the patient and placed in the chart with the patient's or guardian's permission.   Alena Bills Maliea Grandmaison, DO

## 2023-05-26 DIAGNOSIS — Z681 Body mass index (BMI) 19 or less, adult: Secondary | ICD-10-CM | POA: Diagnosis not present

## 2023-05-26 DIAGNOSIS — M62830 Muscle spasm of back: Secondary | ICD-10-CM | POA: Diagnosis not present

## 2023-05-26 DIAGNOSIS — G35 Multiple sclerosis: Secondary | ICD-10-CM | POA: Diagnosis not present

## 2023-06-16 DIAGNOSIS — H0014 Chalazion left upper eyelid: Secondary | ICD-10-CM | POA: Diagnosis not present

## 2023-06-17 DIAGNOSIS — G35 Multiple sclerosis: Secondary | ICD-10-CM | POA: Diagnosis not present

## 2023-06-23 DIAGNOSIS — H0011 Chalazion right upper eyelid: Secondary | ICD-10-CM | POA: Diagnosis not present

## 2023-06-23 DIAGNOSIS — H02882 Meibomian gland dysfunction right lower eyelid: Secondary | ICD-10-CM | POA: Diagnosis not present

## 2023-06-23 DIAGNOSIS — H02885 Meibomian gland dysfunction left lower eyelid: Secondary | ICD-10-CM | POA: Diagnosis not present

## 2023-06-30 ENCOUNTER — Ambulatory Visit: Payer: BC Managed Care – PPO

## 2023-07-05 DIAGNOSIS — L820 Inflamed seborrheic keratosis: Secondary | ICD-10-CM | POA: Diagnosis not present

## 2023-07-06 DIAGNOSIS — G35 Multiple sclerosis: Secondary | ICD-10-CM | POA: Diagnosis not present

## 2023-07-06 DIAGNOSIS — L659 Nonscarring hair loss, unspecified: Secondary | ICD-10-CM | POA: Diagnosis not present

## 2023-07-06 DIAGNOSIS — L853 Xerosis cutis: Secondary | ICD-10-CM | POA: Diagnosis not present

## 2023-07-06 DIAGNOSIS — Z681 Body mass index (BMI) 19 or less, adult: Secondary | ICD-10-CM | POA: Diagnosis not present

## 2023-07-07 DIAGNOSIS — H0014 Chalazion left upper eyelid: Secondary | ICD-10-CM | POA: Diagnosis not present

## 2023-07-07 DIAGNOSIS — H02885 Meibomian gland dysfunction left lower eyelid: Secondary | ICD-10-CM | POA: Diagnosis not present

## 2023-07-07 DIAGNOSIS — H02882 Meibomian gland dysfunction right lower eyelid: Secondary | ICD-10-CM | POA: Diagnosis not present

## 2023-07-07 DIAGNOSIS — H0011 Chalazion right upper eyelid: Secondary | ICD-10-CM | POA: Diagnosis not present

## 2023-07-27 ENCOUNTER — Ambulatory Visit

## 2023-07-30 DIAGNOSIS — M1711 Unilateral primary osteoarthritis, right knee: Secondary | ICD-10-CM | POA: Diagnosis not present

## 2023-09-06 DIAGNOSIS — L818 Other specified disorders of pigmentation: Secondary | ICD-10-CM | POA: Diagnosis not present

## 2023-09-06 DIAGNOSIS — L570 Actinic keratosis: Secondary | ICD-10-CM | POA: Diagnosis not present

## 2023-09-06 DIAGNOSIS — X32XXXD Exposure to sunlight, subsequent encounter: Secondary | ICD-10-CM | POA: Diagnosis not present

## 2023-09-06 DIAGNOSIS — L820 Inflamed seborrheic keratosis: Secondary | ICD-10-CM | POA: Diagnosis not present

## 2023-11-16 ENCOUNTER — Ambulatory Visit
Admission: RE | Admit: 2023-11-16 | Discharge: 2023-11-16 | Disposition: A | Discharge status: Home / Self Care | Source: Ambulatory Visit | Attending: Obstetrics and Gynecology | Admitting: Obstetrics and Gynecology

## 2023-11-16 DIAGNOSIS — Z1231 Encounter for screening mammogram for malignant neoplasm of breast: Secondary | ICD-10-CM

## 2024-03-06 ENCOUNTER — Encounter: Admitting: Family Medicine

## 2024-03-13 ENCOUNTER — Encounter: Admitting: Family Medicine

## 2024-03-13 ENCOUNTER — Encounter (INDEPENDENT_AMBULATORY_CARE_PROVIDER_SITE_OTHER): Admitting: Family Medicine

## 2024-03-13 ENCOUNTER — Encounter: Payer: Self-pay | Admitting: Family Medicine

## 2024-03-13 NOTE — Progress Notes (Signed)
 A user error has taken place: encounter opened in error, closed for administrative reasons.  We did not have patients size orthotics blank.  Needed to order size 5 and size 6 Fit and Run dress orthotics.  She will reschedule once orthotics are received by our office.

## 2024-03-13 NOTE — Patient Instructions (Signed)
 Thank you for coming to see me today. It was a pleasure.   We made you new orthotics. Let us know if we need to add any extra cushioning or make changes.  If you have any questions or concerns, please do not hesitate to call the office at [phone].

## 2024-03-30 ENCOUNTER — Ambulatory Visit (INDEPENDENT_AMBULATORY_CARE_PROVIDER_SITE_OTHER): Admitting: Family Medicine

## 2024-03-30 VITALS — BP 116/70 | Wt 105.0 lb

## 2024-03-30 DIAGNOSIS — M79671 Pain in right foot: Secondary | ICD-10-CM | POA: Diagnosis not present

## 2024-03-30 DIAGNOSIS — M79672 Pain in left foot: Secondary | ICD-10-CM

## 2024-03-30 DIAGNOSIS — R269 Unspecified abnormalities of gait and mobility: Secondary | ICD-10-CM

## 2024-03-30 DIAGNOSIS — M1711 Unilateral primary osteoarthritis, right knee: Secondary | ICD-10-CM | POA: Diagnosis not present

## 2024-03-30 NOTE — Patient Instructions (Signed)
 Thank you for coming to see me today. It was a pleasure.   We made you new orthotics. Let us know if we need to add any extra cushioning or make changes.  If you have any questions or concerns, please do not hesitate to call the office at [phone].

## 2024-03-30 NOTE — Progress Notes (Signed)
 DATE OF VISIT: 03/30/2024        [name] DOB: 03/04/1961 MRN: [id number]  CC: Updated custom orthotics  History of present Illness: Tara Ross is a 64 y.o. female who presents for a follow-up visit updated custom orthotics Has known knee osteoarthritis with gait abnormalities Has been using custom orthotics for several years Last orthotics fabricated 01/28/2023.  These were dress orthotics with blue base and peds scaphoid pads.  These have shown significant wear and are starting to feel less comfortable She has upcoming appointment next week with Dr Edna for a knee surgery, likely partial knee replacement, but could be total knee replacement.  Medications:  Outpatient Encounter Medications as of 03/30/2024  Medication Sig   antiseptic oral rinse (BIOTENE) LIQD 15 mLs by Mouth Rinse route as needed for dry mouth. (Patient not taking: Reported on 10/24/2021)   Ascorbic Acid (VITAMIN C) 1000 MG tablet Take by mouth.   Calcium Carb-Cholecalciferol (CALCIUM 1000 + D PO) 1 tablet with meals   Cholecalciferol (VITAMIN D) 50 MCG (2000 UT) CAPS 1 capsule   DALFAMPRIDINE ER PO Take 10 mg by mouth 2 (two) times daily.   ferrous sulfate 325 (65 FE) MG tablet Take 325 mg by mouth daily with breakfast.   Fingolimod HCl 0.5 MG CAPS See admin instructions.   Magnesium 500 MG CAPS 1 tablet with a meal   Multiple Vitamin (MULTIVITAMIN ADULT PO) Take by mouth.   No facility-administered encounter medications on file as of 03/30/2024.    Allergies: has no known allergies.  Physical Examination: Vitals: BP 116/70   Wt 105 lb (47.6 kg)   BMI 19.20 kg/m  GENERAL:  ABEEHA Ross is a 64 y.o. female appearing their stated age, alert and oriented x 3, in no apparent distress.  MSK: Bilateral genu valgum, bilateral overpronation of the feet with calcaneal valgus-right greater than left.  Mild to moderate longitudinal arch collapse bilaterally. Gait: Overpronation bilaterally, increased genu  valgum on the right with walking. Neuro vastly intact distally Assessment & Plan  1. Abnormality of gait 2. Foot pain, bilateral 3. Patellofemoral arthritis of right knee Chronic gait abnormality with genu valgum and overpronation, confounded by underlying knee osteoarthritis  Plan: - Has done well with previous custom orthotics, but last set is currently quite worn.  Candidate for updated orthotics today.  These were fabricated as noted below  Patient was fitted for a : standard, cushioned, semi-rigid orthotic. The orthotic was heated, placed on the orthotic stand. The patient was positioned in subtalar neutral position and 10 degrees of ankle dorsiflexion in a weight bearing stance on the heated orthotic blank After completion of molding, a stable base was applied to the orthotic blank. The blank was ground to a stable position for weight bearing. Blank: [employer] Run - size 5 Base: blue EVA Posting: bilateral pediatric scaphoid pads and Rt medial heel wedge   Orthotics were comfortable in the office, she had improved gait and alignment, still with slight overpronation on the right.  - Should follow-up with surgeon as scheduled for her knee procedure.  This will likely help with her gait abnormalities as well - Can follow-up with us  on an as needed basis, encouraged to reach out with any question or concern  Patient expressed understanding & agreement with above.  Encounter Diagnoses  Name Primary?   Abnormality of gait Yes   Foot pain, bilateral    Patellofemoral arthritis of right knee     No orders  of the defined types were placed in this encounter.

## 2024-04-20 ENCOUNTER — Ambulatory Visit (HOSPITAL_COMMUNITY): Admit: 2024-04-20 | Admitting: Orthopedic Surgery

## 2024-04-20 SURGERY — ARTHROPLASTY, KNEE, TOTAL
Anesthesia: Spinal | Site: Knee | Laterality: Right
